# Patient Record
Sex: Female | Born: 1965 | Race: White | Hispanic: No | Marital: Married | Smoking: Never smoker
Health system: Southern US, Community
[De-identification: ages and names within clinical notes are randomized; demographics above are authoritative.]

## PROBLEM LIST (undated history)

## (undated) DIAGNOSIS — Z789 Other specified health status: Secondary | ICD-10-CM

## (undated) DIAGNOSIS — K219 Gastro-esophageal reflux disease without esophagitis: Secondary | ICD-10-CM

## (undated) DIAGNOSIS — R7303 Prediabetes: Secondary | ICD-10-CM

## (undated) HISTORY — PX: LASIK: SHX215

## (undated) HISTORY — PX: BREAST SURGERY: SHX581

## (undated) HISTORY — DX: Other specified health status: Z78.9

## (undated) HISTORY — DX: Gastro-esophageal reflux disease without esophagitis: K21.9

---

## 2004-10-25 ENCOUNTER — Ambulatory Visit: Payer: Self-pay

## 2005-10-28 ENCOUNTER — Ambulatory Visit: Payer: Self-pay

## 2007-01-11 ENCOUNTER — Ambulatory Visit: Payer: Self-pay | Admitting: Obstetrics and Gynecology

## 2011-09-23 ENCOUNTER — Ambulatory Visit: Payer: Self-pay | Admitting: Family Medicine

## 2013-03-29 ENCOUNTER — Ambulatory Visit: Payer: Self-pay | Admitting: Obstetrics and Gynecology

## 2014-08-16 ENCOUNTER — Other Ambulatory Visit: Payer: Self-pay

## 2015-02-22 ENCOUNTER — Ambulatory Visit (INDEPENDENT_AMBULATORY_CARE_PROVIDER_SITE_OTHER): Payer: 59 | Admitting: Podiatry

## 2015-02-22 ENCOUNTER — Encounter: Payer: Self-pay | Admitting: Podiatry

## 2015-02-22 ENCOUNTER — Ambulatory Visit (INDEPENDENT_AMBULATORY_CARE_PROVIDER_SITE_OTHER): Payer: 59

## 2015-02-22 VITALS — BP 118/70 | HR 90 | Resp 18

## 2015-02-22 DIAGNOSIS — R52 Pain, unspecified: Secondary | ICD-10-CM | POA: Diagnosis not present

## 2015-02-22 DIAGNOSIS — M722 Plantar fascial fibromatosis: Secondary | ICD-10-CM

## 2015-02-22 MED ORDER — DICLOFENAC SODIUM 75 MG PO TBEC: 75.0000 mg | DELAYED_RELEASE_TABLET | Freq: Two times a day (BID) | ORAL | Status: DC

## 2015-02-22 NOTE — Patient Instructions (Signed)

## 2015-02-22 NOTE — Progress Notes (Signed)
   Subjective:    Patient ID: Elizabeth Rivers, female    DOB: 1965/06/29, 49 y.o.   MRN: [id number]  HPI   49 year old female presents the office with concerns of bilateral foot pain which has been ongoing for greater than 1 year however over the last year she has noticed that has worsened somewhat. She states that she has the majority of her pain about her foot for which she points the heel. She has pain when she first gets up in the morning after prolonged periods of standing and weightbearing. She denies any history of injury or trauma. Denies any swelling or redness. No tingling or numbness. She's had no previous treatment other than taking Aleve which seems to help. The pain does not wake her up at night. Denies any claudication symptoms. No other complaints at this time.   Review of Systems  All other systems reviewed and are negative.      Objective:   Physical Exam General: AAO x3, NAD  Dermatological: Skin is warm, dry and supple bilateral. Nails x 10 are well manicured; remaining integument appears unremarkable at this time. There are no open sores, no preulcerative lesions, no rash or signs of infection present.  Vascular: Dorsalis Pedis artery and Posterior Tibial artery pedal pulses are 2/4 bilateral with immedate capillary fill time. Pedal hair growth present. No varicosities and no lower extremity edema present bilateral. There is no pain with calf compression, swelling, warmth, erythema.   Neruologic: Grossly intact via light touch bilateral. Vibratory intact via tuning fork bilateral. Protective threshold with Semmes Wienstein monofilament intact to all pedal sites bilateral. Patellar and Achilles deep tendon reflexes 2+ bilateral. No Babinski or clonus noted bilateral.   Musculoskeletal: No gross boney pedal deformities bilateral. There is tenderness palpation along the plantar medial tubercle of the calcaneus at the insertion the plantar fascial bilaterally. There is no pain  on the course of the plantar fascial bilaterally. The plantar fascia appears to be intact. There is no pain on the course last insertion of the Achilles tendon. No other areas tenderness to bilateral lower extremity. No pain, crepitus, or limitation noted with foot and ankle range of motion bilateral. Muscular strength 5/5 in all groups tested bilateral.  Gait: Unassisted, Nonantalgic.      Assessment & Plan:   49 year old female the bilateral heel pain, likely plantar fasciitis -X-rays were obtained and reviewed with the patient.  -Treatment options discussed including all alternatives, risks, and complications -Etiology of symptoms were discussed -Discussed steroid injection however she wishes to hold off at this time. -Prescribed voltaren. Discussed side effects of the medication and directed to stop if any are to occur and call the office.  -Stretching exercises daily. -Ice daily. -Discussed shoe gear modifications and orthotics. -dispensed plantar fascial brace. -Follow-up in 3 weeks or sooner if any problems arise. In the meantime, encouraged to call the office with any questions, concerns, change in symptoms.   Ovid CurdMatthew Teyanna Thielman, DPM

## 2015-03-22 ENCOUNTER — Encounter: Payer: Self-pay | Admitting: Podiatry

## 2015-03-22 ENCOUNTER — Ambulatory Visit (INDEPENDENT_AMBULATORY_CARE_PROVIDER_SITE_OTHER): Payer: 59 | Admitting: Podiatry

## 2015-03-22 VITALS — BP 130/72 | HR 91 | Resp 18

## 2015-03-22 DIAGNOSIS — M722 Plantar fascial fibromatosis: Secondary | ICD-10-CM

## 2015-03-22 NOTE — Patient Instructions (Signed)

## 2015-03-25 NOTE — Progress Notes (Signed)
Patient ID: Elizabeth Rivers, female   DOB: 10-07-1965, 49 y.o.   MRN: [id number]  Subjective: Elizabeth Rivers presents to the office today for follow-up evaluation of bilateral heel pain. They state that they are doing much better. They have been icing, stretching, try to wear supportive shoe as much as possible. She gets some occasional discomfort, but it is much improved. No pain at night. No numbness or tingling.   No other complaints at this time. No acute changes since last appointment. They deny any systemic complaints such as fevers, chills, nausea, vomiting.  Objective: General: AAO x3, NAD  Dermatological: Skin is warm, dry and supple bilateral. Nails x 10 are well manicured; remaining integument appears unremarkable at this time. There are no open sores, no preulcerative lesions, no rash or signs of infection present.  Vascular: Dorsalis Pedis artery and Posterior Tibial artery pedal pulses are 2/4 bilateral with immedate capillary fill time. Pedal hair growth present. There is no pain with calf compression, swelling, warmth, erythema.   Neruologic: Grossly intact via light touch bilateral. Vibratory intact via tuning fork bilateral. Protective threshold with Semmes Wienstein monofilament intact to all pedal sites bilateral.   Musculoskeletal: There is currently very mild discomfort to palpation along the plantar medial tubercle of the calcaneus at the insertion of the plantar fascia on the left and right foot. There is no pain along the course of the plantar fascia within the arch of the foot. Plantar fascia appears to be intact bilaterally. There is no pain with lateral compression of the calcaneus and there is no pain with vibratory sensation. There is no pain along the course or insertion of the Achilles tendon. There are no other areas of tenderness to bilateral lower extremities. No gross boney pedal deformities bilateral. No pain, crepitus, or limitation noted with foot and ankle  range of motion bilateral. Muscular strength 5/5 in all groups tested bilateral.  Gait: Unassisted, Nonantalgic.   Assessment: Presents for follow-up evaluation for heel pain, likely plantar fasciitis   Plan: -Treatment options discussed including all alternatives, risks, and complications -Discussed steroid injection,  However this time since her pain is greatly improved we will hold off on that at this time. -Ice and stretching exercises on a daily basis. -Continue supportive shoe gear and discussed orthotics.  -Voltaren prn -She is doing well at this point and not having any pain currently.  Follow-up as needed or if any problems arise. In the meantime, encouraged to call the office with any questions, concerns, change in symptoms.   Ovid CurdMatthew Wagoner, DPM

## 2015-10-03 ENCOUNTER — Other Ambulatory Visit: Payer: Self-pay | Admitting: Podiatry

## 2016-03-05 ENCOUNTER — Other Ambulatory Visit: Payer: Self-pay | Admitting: Obstetrics and Gynecology

## 2016-03-05 DIAGNOSIS — Z1231 Encounter for screening mammogram for malignant neoplasm of breast: Secondary | ICD-10-CM

## 2016-04-18 ENCOUNTER — Telehealth: Payer: Self-pay | Admitting: Gastroenterology

## 2016-04-18 ENCOUNTER — Ambulatory Visit
Admission: RE | Admit: 2016-04-18 | Discharge: 2016-04-18 | Disposition: A | Discharge status: Home / Self Care | Payer: 59 | Source: Ambulatory Visit | Attending: Obstetrics and Gynecology | Admitting: Obstetrics and Gynecology

## 2016-04-18 DIAGNOSIS — Z1231 Encounter for screening mammogram for malignant neoplasm of breast: Secondary | ICD-10-CM | POA: Diagnosis present

## 2016-04-18 NOTE — Telephone Encounter (Signed)
colonoscopy

## 2016-04-22 LAB — HM PAP SMEAR: HM PAP: NORMAL

## 2016-05-08 NOTE — Telephone Encounter (Signed)
VM was left for pt on 05/02/16. 

## 2016-06-20 ENCOUNTER — Telehealth: Payer: Self-pay

## 2016-06-20 NOTE — Telephone Encounter (Signed)
LVM for pt callback to schedule screening colonoscopy per Dr. Thomasene MohairStephen Jackson

## 2016-07-15 ENCOUNTER — Other Ambulatory Visit: Payer: Self-pay | Admitting: Obstetrics and Gynecology

## 2016-07-15 DIAGNOSIS — Z1211 Encounter for screening for malignant neoplasm of colon: Secondary | ICD-10-CM

## 2016-07-24 ENCOUNTER — Other Ambulatory Visit: Payer: Self-pay | Admitting: Obstetrics and Gynecology

## 2016-07-24 DIAGNOSIS — Z1211 Encounter for screening for malignant neoplasm of colon: Secondary | ICD-10-CM

## 2016-07-27 ENCOUNTER — Other Ambulatory Visit: Payer: Self-pay | Admitting: Obstetrics and Gynecology

## 2016-07-27 DIAGNOSIS — Z1211 Encounter for screening for malignant neoplasm of colon: Secondary | ICD-10-CM

## 2017-04-22 ENCOUNTER — Ambulatory Visit (INDEPENDENT_AMBULATORY_CARE_PROVIDER_SITE_OTHER): Payer: 59 | Admitting: Obstetrics and Gynecology

## 2017-04-22 ENCOUNTER — Encounter: Payer: Self-pay | Admitting: Obstetrics and Gynecology

## 2017-04-22 VITALS — BP 118/74 | Ht 64.0 in | Wt 234.0 lb

## 2017-04-22 DIAGNOSIS — Z13220 Encounter for screening for lipoid disorders: Secondary | ICD-10-CM

## 2017-04-22 DIAGNOSIS — Z0000 Encounter for general adult medical examination without abnormal findings: Secondary | ICD-10-CM | POA: Diagnosis not present

## 2017-04-22 DIAGNOSIS — Z1329 Encounter for screening for other suspected endocrine disorder: Secondary | ICD-10-CM

## 2017-04-22 DIAGNOSIS — Z131 Encounter for screening for diabetes mellitus: Secondary | ICD-10-CM | POA: Diagnosis not present

## 2017-04-22 DIAGNOSIS — Z1321 Encounter for screening for nutritional disorder: Secondary | ICD-10-CM | POA: Diagnosis not present

## 2017-04-22 DIAGNOSIS — Z1331 Encounter for screening for depression: Secondary | ICD-10-CM | POA: Diagnosis not present

## 2017-04-22 DIAGNOSIS — Z01419 Encounter for gynecological examination (general) (routine) without abnormal findings: Secondary | ICD-10-CM

## 2017-04-22 DIAGNOSIS — Z1339 Encounter for screening examination for other mental health and behavioral disorders: Secondary | ICD-10-CM

## 2017-04-22 NOTE — Progress Notes (Signed)
Routine Annual Gynecology Examination   PCP: Conard Novak, MD  Chief Complaint  Patient presents with  . Annual Exam   History of Present Illness: Patient is a 52 y.o. G2P2002 presents for annual exam. The patient has no complaints today.   Menopausal bleeding: denies  Menopausal symptoms: denies  Breast symptoms: denies  Last pap smear: 1 years ago.  Result Normal  Last mammogram: 1 years ago.  Result Normal   Colonoscopy: ordered last year, but patient did not make referral appointment.  Discussed Cologard versus just getting colonoscopy.  Past Medical History: denies  Past Surgical History:  Procedure Laterality Date  . BREAST SURGERY    . LASIK      Medications   Medication Sig Start Date End Date Taking? Authorizing Provider  Ascorbic Acid (VITAMIN C) 100 MG tablet Take 100 mg by mouth daily.   Yes [provider]  AFLURIA PRESERVATIVE FREE 0.5 ML SUSY inject 0.5 milliliter intramuscularly 01/29/15   [provider]   Allergies: No Known Allergies  Obstetric History: [id number]  Social History   Socioeconomic History  . Marital status: Married    Spouse name: Not on file  . Number of children: Not on file  . Years of education: Not on file  . Highest education level: Not on file  Social Needs  . Financial resource strain: Not on file  . Food insecurity - worry: Not on file  . Food insecurity - inability: Not on file  . Transportation needs - medical: Not on file  . Transportation needs - non-medical: Not on file  Occupational History  . Not on file  Tobacco Use  . Smoking status: Never Smoker  . Smokeless tobacco: Never Used  Substance and Sexual Activity  . Alcohol use: No    Alcohol/week: 0.0 oz  . Drug use: No  . Sexual activity: Not Currently    Birth control/protection: Surgical  Other Topics Concern  . Not on file  Social History Narrative  . Not on file    Family History  Problem Relation Age of Onset  .  Diabetes Mellitus II Mother   . Heart disease Mother   . Diabetes Mellitus II Father   . Heart disease Father   . Hypertension Maternal Grandmother   . Breast cancer Neg Hx     Review of Systems  Constitutional: Negative.   HENT: Negative.   Eyes: Negative.   Respiratory: Negative.   Cardiovascular: Negative.   Gastrointestinal: Negative.   Genitourinary: Negative.   Musculoskeletal: Negative.   Skin: Negative.   Neurological: Negative.   Psychiatric/Behavioral: Negative.      Physical Exam Vitals: BP 118/74   Ht 5\' 4"  (1.626 m)   Wt 234 lb (106.1 kg)   BMI 40.17 kg/m   Physical Exam  Constitutional: She is oriented to person, place, and time. She appears well-developed and well-nourished. No distress.  Genitourinary: Uterus normal. Pelvic exam was performed with patient supine. There is no rash, tenderness, lesion or injury on the right labia. There is no rash, tenderness, lesion or injury on the left labia. No erythema, tenderness or bleeding in the vagina. No signs of injury around the vagina. No vaginal discharge found. Right adnexum does not display mass, does not display tenderness and does not display fullness. Left adnexum does not display mass, does not display tenderness and does not display fullness. Cervix does not exhibit motion tenderness, lesion, discharge or polyp.   Uterus is mobile and anteverted. Uterus  is not enlarged, tender or exhibiting a mass.  HENT:  Head: Normocephalic and atraumatic.  Eyes: EOM are normal. No scleral icterus.  Neck: Normal range of motion. Neck supple. No thyromegaly present.  Cardiovascular: Normal rate and regular rhythm. Exam reveals no gallop and no friction rub.  No murmur heard. Pulmonary/Chest: Effort normal and breath sounds normal. No respiratory distress. She has no wheezes. She has no rales. Right breast exhibits no inverted nipple, no mass, no nipple discharge, no skin change and no tenderness. Left breast exhibits no  inverted nipple, no mass, no nipple discharge, no skin change and no tenderness.  Abdominal: Soft. Bowel sounds are normal. She exhibits no distension and no mass. There is no tenderness. There is no rebound and no guarding.  Musculoskeletal: Normal range of motion. She exhibits no edema or tenderness.  Lymphadenopathy:    She has no cervical adenopathy.       Right: No inguinal adenopathy present.       Left: No inguinal adenopathy present.  Neurological: She is alert and oriented to person, place, and time. No cranial nerve deficit.  Skin: Skin is warm and dry. No rash noted. No erythema.  Psychiatric: She has a normal mood and affect. Her behavior is normal. Judgment normal.    Female chaperone present for pelvic and breast  portions of the physical exam  Results: AUDIT Questionnaire (screen for alcoholism): 0 PHQ-9: 6   Assessment and Plan:  52 y.o. 212P2002 female here for routine annual gynecologic examination  Plan: Problem List Items Addressed This Visit    None    Visit Diagnoses    Women's annual routine gynecological examination    -  Primary   Relevant Orders   Hgb A1c w/o eAG   TSH   Lipid Panel With LDL/HDL Ratio   CBC   Comprehensive metabolic panel   VITAMIN D 25 Hydroxy (Vit-D Deficiency, Fractures)   Screening for depression       Screening for alcoholism       Screening cholesterol level       Relevant Orders   Lipid Panel With LDL/HDL Ratio   Screening for diabetes mellitus       Relevant Orders   Hgb A1c w/o eAG   Screening for thyroid disorder       Relevant Orders   TSH   Encounter for vitamin deficiency screening       Relevant Orders   VITAMIN D 25 Hydroxy (Vit-D Deficiency, Fractures)   Laboratory examination ordered as part of a routine general medical examination       Relevant Orders   CBC   Comprehensive metabolic panel   VITAMIN D 25 Hydroxy (Vit-D Deficiency, Fractures)      Screening: -- Blood pressure screen normal --  Colonoscopy - due.  Patient will check with new insurance to see which providers are covered. Will consider Cologuard -- Mammogram - due. Patient to call [facility] to schedule -- Weight screening: obese: discussed management options, including lifestyle, dietary, and exercise. -- Depression screening negative (PHQ-9) -- Nutrition: normal -- cholesterol screening: will obtain -- osteoporosis screening: not due -- tobacco screening: not using -- alcohol screening: AUDIT questionnaire indicates low-risk usage. -- family history of breast cancer screening: done. not at high risk. -- no evidence of domestic violence or intimate partner violence. -- STD screening: gonorrhea/chlamydia NAAT not collected per patient request. -- pap smear not collected per ASCCP guidelines -- HPV vaccination series: not eligilbe  Jeannett SeniorStephen  Jean Rosenthal, MD 04/22/2017 5:35 PM

## 2017-04-23 LAB — COMPREHENSIVE METABOLIC PANEL
A/G RATIO: 1.5 (ref 1.2–2.2)
ALBUMIN: 4.3 g/dL (ref 3.5–5.5)
ALT: 12 IU/L (ref 0–32)
AST: 12 IU/L (ref 0–40)
Alkaline Phosphatase: 120 IU/L — ABNORMAL HIGH (ref 39–117)
BUN / CREAT RATIO: 9 (ref 9–23)
BUN: 10 mg/dL (ref 6–24)
Bilirubin Total: 0.3 mg/dL (ref 0.0–1.2)
CALCIUM: 9 mg/dL (ref 8.7–10.2)
CO2: 23 mmol/L (ref 20–29)
Chloride: 103 mmol/L (ref 96–106)
Creatinine, Ser: 1.06 mg/dL — ABNORMAL HIGH (ref 0.57–1.00)
GFR, EST AFRICAN AMERICAN: 70 mL/min/{1.73_m2} (ref >59)
GFR, EST NON AFRICAN AMERICAN: 61 mL/min/{1.73_m2} (ref >59)
Globulin, Total: 2.8 g/dL (ref 1.5–4.5)
Glucose: 75 mg/dL (ref 65–99)
POTASSIUM: 3.7 mmol/L (ref 3.5–5.2)
Sodium: 144 mmol/L (ref 134–144)
Total Protein: 7.1 g/dL (ref 6.0–8.5)

## 2017-04-23 LAB — LIPID PANEL WITH LDL/HDL RATIO
CHOLESTEROL TOTAL: 217 mg/dL — AB (ref 100–199)
HDL: 45 mg/dL (ref >39)
LDL Calculated: 149 mg/dL — ABNORMAL HIGH (ref 0–99)
LDl/HDL Ratio: 3.3 ratio — ABNORMAL HIGH (ref 0.0–3.2)
Triglycerides: 114 mg/dL (ref 0–149)
VLDL Cholesterol Cal: 23 mg/dL (ref 5–40)

## 2017-04-23 LAB — CBC
HEMATOCRIT: 37.3 % (ref 34.0–46.6)
HEMOGLOBIN: 11.8 g/dL (ref 11.1–15.9)
MCH: 26 pg — AB (ref 26.6–33.0)
MCHC: 31.6 g/dL (ref 31.5–35.7)
MCV: 82 fL (ref 79–97)
Platelets: 312 10*3/uL (ref 150–379)
RBC: 4.53 x10E6/uL (ref 3.77–5.28)
RDW: 15.9 % — ABNORMAL HIGH (ref 12.3–15.4)
WBC: 7.7 10*3/uL (ref 3.4–10.8)

## 2017-04-23 LAB — HGB A1C W/O EAG: HEMOGLOBIN A1C: 5.9 % — AB (ref 4.8–5.6)

## 2017-04-23 LAB — VITAMIN D 25 HYDROXY (VIT D DEFICIENCY, FRACTURES): Vit D, 25-Hydroxy: 14.9 ng/mL — ABNORMAL LOW (ref 30.0–100.0)

## 2017-04-23 LAB — TSH: TSH: 2 u[IU]/mL (ref 0.450–4.500)

## 2017-05-11 ENCOUNTER — Telehealth: Payer: Self-pay | Admitting: Obstetrics and Gynecology

## 2017-05-11 NOTE — Telephone Encounter (Signed)
Spoke with patient regarding results. Recommended dietary/nutrition counseling for hemoglobin A1c and increase vitamin D/Calcium intake.  Offered 50,000 units vitamin D. But, she would like to try supplementation with OTC dosing for now. Also, discussed lipid panel results. Will watch them closely, but would prefer to have them come down.  Again offered nutrition/dietary counseling.

## 2017-05-19 ENCOUNTER — Encounter: Payer: Self-pay | Admitting: Obstetrics and Gynecology

## 2017-06-09 ENCOUNTER — Encounter: Payer: Self-pay | Admitting: Obstetrics and Gynecology

## 2017-12-15 ENCOUNTER — Telehealth: Payer: Self-pay

## 2017-12-15 NOTE — Telephone Encounter (Signed)
Pt called triage line stating she has noticed red blood streaks on toilet tissue for the past 2 days. She has not had a period in 3 years. She is wanting to speak to [facility]. She is leaving to go on vacation this weekend. CB# [phone]  I tried to get her to schedule an appointment for when she gets back from vacation. She states she would rather talk to him first. I assured her he would need to see her but that I would pass on this message.

## 2017-12-15 NOTE — Telephone Encounter (Signed)
Please get pt scheduled, tried to call pt with no answer. She will need to come in for appt with SDJ  to see what is going on.

## 2018-06-11 ENCOUNTER — Other Ambulatory Visit: Payer: Self-pay

## 2018-06-11 ENCOUNTER — Ambulatory Visit (INDEPENDENT_AMBULATORY_CARE_PROVIDER_SITE_OTHER): Payer: 59 | Admitting: Obstetrics and Gynecology

## 2018-06-11 ENCOUNTER — Encounter: Payer: Self-pay | Admitting: Obstetrics and Gynecology

## 2018-06-11 VITALS — BP 106/64 | HR 97 | Ht 64.0 in | Wt 237.0 lb

## 2018-06-11 DIAGNOSIS — Z0000 Encounter for general adult medical examination without abnormal findings: Secondary | ICD-10-CM

## 2018-06-11 DIAGNOSIS — Z01419 Encounter for gynecological examination (general) (routine) without abnormal findings: Secondary | ICD-10-CM

## 2018-06-11 DIAGNOSIS — E559 Vitamin D deficiency, unspecified: Secondary | ICD-10-CM

## 2018-06-11 DIAGNOSIS — Z1329 Encounter for screening for other suspected endocrine disorder: Secondary | ICD-10-CM

## 2018-06-11 DIAGNOSIS — Z1331 Encounter for screening for depression: Secondary | ICD-10-CM

## 2018-06-11 DIAGNOSIS — Z1339 Encounter for screening examination for other mental health and behavioral disorders: Secondary | ICD-10-CM

## 2018-06-11 DIAGNOSIS — Z131 Encounter for screening for diabetes mellitus: Secondary | ICD-10-CM

## 2018-06-11 DIAGNOSIS — Z13220 Encounter for screening for lipoid disorders: Secondary | ICD-10-CM

## 2018-06-11 DIAGNOSIS — Z1211 Encounter for screening for malignant neoplasm of colon: Secondary | ICD-10-CM

## 2018-06-11 NOTE — Progress Notes (Signed)
Routine Annual Gynecology Examination   PCP: Conard Novak, MD  Chief Complaint  Patient presents with  . Gynecologic Exam    No Complaints   History of Present Illness: Patient is a 53 y.o. [id number] presents for annual exam. The patient has no complaints today.   Menopausal bleeding: denies  Menopausal symptoms: denies  Breast symptoms: denies  Last pap smear: 2 years ago.  Result Normal  Last mammogram: 1 years ago.  Result Normal. She has not scheduled another one.   Last colonoscopy: never had.  Did not do Cologuard last year.  Past Medical History:  Diagnosis Date  . No known health problems     Past Surgical History:  Procedure Laterality Date  . BREAST SURGERY    . LASIK     Prior to Admission medications   Medication Sig Start Date End Date Taking? Authorizing Provider  AFLURIA PRESERVATIVE FREE 0.5 ML SUSY inject 0.5 milliliter intramuscularly 01/29/15   [provider]  Ascorbic Acid (VITAMIN C) 100 MG tablet Take 100 mg by mouth daily.    [provider]  diclofenac (VOLTAREN) 75 MG EC tablet Take 1 tablet (75 mg total) by mouth 2 (two) times daily. Patient not taking: Reported on 04/22/2017 02/22/15   Vivi Barrack, DPM  medroxyPROGESTERone (PROVERA) 10 MG tablet TAKE 1 TABLET BY MOUTH ONCE DAILY FOR 10 DAYS 01/26/15   [provider]   Allergies: No Known Allergies  Obstetric History: [id number]  Social History   Socioeconomic History  . Marital status: Married    Spouse name: Not on file  . Number of children: Not on file  . Years of education: Not on file  . Highest education level: Not on file  Occupational History  . Occupation: Biochemist, clinical  Social Needs  . Financial resource strain: Not on file  . Food insecurity:    Worry: Not on file    Inability: Not on file  . Transportation needs:    Medical: Not on file    Non-medical: Not on file  Tobacco Use  . Smoking status: Never Smoker  .  Smokeless tobacco: Never Used  Substance and Sexual Activity  . Alcohol use: No    Alcohol/week: 0.0 standard drinks  . Drug use: No  . Sexual activity: Not Currently    Birth control/protection: Surgical  Lifestyle  . Physical activity:    Days per week: 0 days    Minutes per session: Not on file  . Stress: Not on file  Relationships  . Social connections:    Talks on phone: Not on file    Gets together: Not on file    Attends religious service: Not on file    Active member of club or organization: Not on file    Attends meetings of clubs or organizations: Not on file    Relationship status: Not on file  . Intimate partner violence:    Fear of current or ex partner: Not on file    Emotionally abused: Not on file    Physically abused: Not on file    Forced sexual activity: Not on file  Other Topics Concern  . Not on file  Social History Narrative  . Not on file    Family History  Problem Relation Age of Onset  . Diabetes Mellitus II Mother   . Heart disease Mother   . Diabetes Mellitus II Father   . Heart disease Father   . Hypertension Maternal Grandmother   .  Throat cancer Paternal Uncle   . Breast cancer Neg Hx     Review of Systems  Constitutional: Negative.   HENT: Negative.   Eyes: Negative.   Respiratory: Negative.   Cardiovascular: Negative.   Gastrointestinal: Negative.   Genitourinary: Negative.   Musculoskeletal: Negative.   Skin: Negative.   Neurological: Negative.   Psychiatric/Behavioral: Negative.      Physical Exam Vitals: BP 106/64 (BP Location: Left Arm, Patient Position: Sitting, Cuff Size: Normal)   Pulse 97   Ht 5\' 4"  (1.626 m)   Wt 237 lb (107.5 kg)   BMI 40.68 kg/m   Physical Exam Constitutional:      General: She is not in acute distress.    Appearance: Normal appearance. She is well-developed.  Genitourinary:     Pelvic exam was performed with patient supine.     Vulva, urethra, bladder and uterus normal.     No inguinal  adenopathy present in the right or left side.    No signs of injury in the vagina.     No vaginal discharge, erythema, tenderness or bleeding.     No cervical motion tenderness, discharge, lesion or polyp.     Uterus is mobile.     Uterus is not enlarged or tender.     No uterine mass detected.    Uterus is anteverted.     No right or left adnexal mass present.     Right adnexa not tender or full.     Left adnexa not tender or full.  HENT:     Head: Normocephalic and atraumatic.  Eyes:     General: No scleral icterus.    Conjunctiva/sclera: Conjunctivae normal.  Neck:     Musculoskeletal: Normal range of motion and neck supple.     Thyroid: No thyromegaly.  Cardiovascular:     Rate and Rhythm: Normal rate and regular rhythm.     Heart sounds: No murmur. No friction rub. No gallop.   Pulmonary:     Effort: Pulmonary effort is normal. No respiratory distress.     Breath sounds: Normal breath sounds. No wheezing or rales.  Chest:     Breasts:        Right: No inverted nipple, mass, nipple discharge, skin change or tenderness.        Left: No inverted nipple, mass, nipple discharge, skin change or tenderness.  Abdominal:     General: Bowel sounds are normal. There is no distension.     Palpations: Abdomen is soft. There is no mass.     Tenderness: There is no abdominal tenderness. There is no guarding or rebound.  Musculoskeletal: Normal range of motion.        General: No swelling or tenderness.  Lymphadenopathy:     Cervical: No cervical adenopathy.     Lower Body: No right inguinal adenopathy. No left inguinal adenopathy.  Neurological:     General: No focal deficit present.     Mental Status: She is alert and oriented to person, place, and time.     Cranial Nerves: No cranial nerve deficit.  Skin:    General: Skin is warm and dry.     Findings: No erythema or rash.     Comments: Under left axilla there are two large apparent skin tags  Psychiatric:        Mood and  Affect: Mood normal.        Behavior: Behavior normal.        Judgment: Judgment normal.  Female chaperone present for pelvic and breast  portions of the physical exam  Results: AUDIT Questionnaire (screen for alcoholism): 0 PHQ-9: 7   Assessment and Plan:  53 y.o. G2P2002 female here for routine annual gynecologic examination  Plan: Problem List Items Addressed This Visit    None    Visit Diagnoses    Women's annual routine gynecological examination    -  Primary   Relevant Orders   Cologuard   Hgb A1c w/o eAG   TSH + free T4   VITAMIN D 25 Hydroxy (Vit-D Deficiency, Fractures)   CBC   Comprehensive metabolic panel   Lipid Panel With LDL/HDL Ratio   Screening for depression       Screening for alcoholism       Screen for colon cancer       Relevant Orders   Cologuard   Screening for thyroid disorder       Relevant Orders   TSH + free T4   Screening cholesterol level       Relevant Orders   Lipid Panel With LDL/HDL Ratio   Screening for diabetes mellitus       Relevant Orders   Hgb A1c w/o eAG   Laboratory examination ordered as part of a routine general medical examination       Relevant Orders   Hgb A1c w/o eAG   TSH + free T4   VITAMIN D 25 Hydroxy (Vit-D Deficiency, Fractures)   CBC   Comprehensive metabolic panel   Lipid Panel With LDL/HDL Ratio   Hypovitaminosis D       Relevant Orders   VITAMIN D 25 Hydroxy (Vit-D Deficiency, Fractures)     Screening: -- Blood pressure screen normal -- Colonoscopy - will order Cologuard -- Mammogram - due. Patient to schedule -- Weight screening: obese: discussed management options, including lifestyle, dietary, and exercise. -- Depression screening negative (PHQ-9) -- Nutrition: normal -- cholesterol screening: will obtain -- osteoporosis screening: not due -- tobacco screening: not using -- alcohol screening: AUDIT questionnaire indicates low-risk usage. -- family history of breast cancer screening: done.  not at high risk. -- no evidence of domestic violence or intimate partner violence. -- STD screening: gonorrhea/chlamydia NAAT not collected per patient request. -- pap smear not collected per ASCCP guidelines -- flu vaccine received -- HPV vaccination series: not eligilbe  Thomasene Mohair, MD 06/11/2018 4:02 PM

## 2018-06-12 LAB — CBC
Hematocrit: 38.1 % (ref 34.0–46.6)
Hemoglobin: 12.1 g/dL (ref 11.1–15.9)
MCH: 26.5 pg — AB (ref 26.6–33.0)
MCHC: 31.8 g/dL (ref 31.5–35.7)
MCV: 84 fL (ref 79–97)
PLATELETS: 300 10*3/uL (ref 150–450)
RBC: 4.56 x10E6/uL (ref 3.77–5.28)
RDW: 14.4 % (ref 11.7–15.4)
WBC: 7.7 10*3/uL (ref 3.4–10.8)

## 2018-06-12 LAB — COMPREHENSIVE METABOLIC PANEL
ALK PHOS: 110 IU/L (ref 39–117)
ALT: 16 IU/L (ref 0–32)
AST: 14 IU/L (ref 0–40)
Albumin/Globulin Ratio: 1.8 (ref 1.2–2.2)
Albumin: 4.2 g/dL (ref 3.8–4.9)
BILIRUBIN TOTAL: 0.2 mg/dL (ref 0.0–1.2)
BUN/Creatinine Ratio: 14 (ref 9–23)
BUN: 12 mg/dL (ref 6–24)
CHLORIDE: 103 mmol/L (ref 96–106)
CO2: 23 mmol/L (ref 20–29)
Calcium: 9.3 mg/dL (ref 8.7–10.2)
Creatinine, Ser: 0.87 mg/dL (ref 0.57–1.00)
GFR calc Af Amer: 88 mL/min/{1.73_m2} (ref >59)
GFR calc non Af Amer: 76 mL/min/{1.73_m2} (ref >59)
Globulin, Total: 2.3 g/dL (ref 1.5–4.5)
Glucose: 82 mg/dL (ref 65–99)
Potassium: 4.1 mmol/L (ref 3.5–5.2)
Sodium: 140 mmol/L (ref 134–144)
Total Protein: 6.5 g/dL (ref 6.0–8.5)

## 2018-06-12 LAB — TSH+FREE T4
FREE T4: 1.02 ng/dL (ref 0.82–1.77)
TSH: 2.44 u[IU]/mL (ref 0.450–4.500)

## 2018-06-12 LAB — LIPID PANEL WITH LDL/HDL RATIO
Cholesterol, Total: 205 mg/dL — ABNORMAL HIGH (ref 100–199)
HDL: 47 mg/dL (ref >39)
LDL Calculated: 125 mg/dL — ABNORMAL HIGH (ref 0–99)
LDl/HDL Ratio: 2.7 ratio (ref 0.0–3.2)
TRIGLYCERIDES: 163 mg/dL — AB (ref 0–149)
VLDL Cholesterol Cal: 33 mg/dL (ref 5–40)

## 2018-06-12 LAB — HGB A1C W/O EAG: Hgb A1c MFr Bld: 6 % — ABNORMAL HIGH (ref 4.8–5.6)

## 2018-06-12 LAB — VITAMIN D 25 HYDROXY (VIT D DEFICIENCY, FRACTURES): VIT D 25 HYDROXY: 11.1 ng/mL — AB (ref 30.0–100.0)

## 2019-07-05 ENCOUNTER — Other Ambulatory Visit: Payer: Self-pay

## 2019-07-05 ENCOUNTER — Encounter: Payer: Self-pay | Admitting: Obstetrics and Gynecology

## 2019-07-05 ENCOUNTER — Other Ambulatory Visit (HOSPITAL_COMMUNITY)
Admission: RE | Admit: 2019-07-05 | Discharge: 2019-07-05 | Disposition: A | Discharge status: Home / Self Care | Payer: 59 | Source: Ambulatory Visit | Attending: Obstetrics and Gynecology | Admitting: Obstetrics and Gynecology

## 2019-07-05 ENCOUNTER — Ambulatory Visit (INDEPENDENT_AMBULATORY_CARE_PROVIDER_SITE_OTHER): Payer: 59 | Admitting: Obstetrics and Gynecology

## 2019-07-05 VITALS — BP 122/74 | Ht 64.0 in | Wt 234.0 lb

## 2019-07-05 DIAGNOSIS — Z01411 Encounter for gynecological examination (general) (routine) with abnormal findings: Secondary | ICD-10-CM | POA: Diagnosis not present

## 2019-07-05 DIAGNOSIS — Z1211 Encounter for screening for malignant neoplasm of colon: Secondary | ICD-10-CM

## 2019-07-05 DIAGNOSIS — Z01419 Encounter for gynecological examination (general) (routine) without abnormal findings: Secondary | ICD-10-CM | POA: Diagnosis present

## 2019-07-05 DIAGNOSIS — Z124 Encounter for screening for malignant neoplasm of cervix: Secondary | ICD-10-CM

## 2019-07-05 DIAGNOSIS — Z1231 Encounter for screening mammogram for malignant neoplasm of breast: Secondary | ICD-10-CM

## 2019-07-05 DIAGNOSIS — Z1339 Encounter for screening examination for other mental health and behavioral disorders: Secondary | ICD-10-CM

## 2019-07-05 DIAGNOSIS — Z1331 Encounter for screening for depression: Secondary | ICD-10-CM

## 2019-07-05 DIAGNOSIS — N950 Postmenopausal bleeding: Secondary | ICD-10-CM

## 2019-07-05 NOTE — Progress Notes (Signed)
Routine Annual Gynecology Examination   PCP: Will Bonnet, MD  Chief Complaint  Patient presents with  . Annual Exam   History of Present Illness: Patient is a 54 y.o. [id number] presents for annual exam. The patient has no complaints today.   Menopausal bleeding: notes very light vaginal bleeding.  She noted light pink just when she wiped.  Denies weight loss, early satiety, constipation, bloating.  Menopausal symptoms: reports mild hot flushes. Less than before.   Breast symptoms: denies  Last pap smear: 3 years ago.  Result Normal  Last mammogram: 2 years ago.  Result Normal   Last colonoscopy: hasn't had colonoscopy or Cologuard.  She still has Cologuard at home.   Past Medical History:  Diagnosis Date  . No known health problems     Past Surgical History:  Procedure Laterality Date  . BREAST SURGERY    . LASIK      Prior to Admission medications: denies   Allergies:  No Known Allergies  Obstetric History: [id number]  Social History   Socioeconomic History  . Marital status: Married    Spouse name: Not on file  . Number of children: Not on file  . Years of education: Not on file  . Highest education level: Not on file  Occupational History  . Occupation: IT sales professional  Tobacco Use  . Smoking status: Never Smoker  . Smokeless tobacco: Never Used  Substance and Sexual Activity  . Alcohol use: No    Alcohol/week: 0.0 standard drinks  . Drug use: No  . Sexual activity: Not Currently    Birth control/protection: Surgical  Other Topics Concern  . Not on file  Social History Narrative  . Not on file   Social Determinants of Health   Financial Resource Strain:   . Difficulty of Paying Living Expenses:   Food Insecurity:   . Worried About Charity fundraiser in the Last Year:   . Arboriculturist in the Last Year:   Transportation Needs:   . Film/video editor (Medical):   Marland Kitchen Lack of Transportation (Non-Medical):   Physical  Activity:   . Days of Exercise per Week:   . Minutes of Exercise per Session:   Stress:   . Feeling of Stress :   Social Connections:   . Frequency of Communication with Friends and Family:   . Frequency of Social Gatherings with Friends and Family:   . Attends Religious Services:   . Active Member of Clubs or Organizations:   . Attends Archivist Meetings:   Marland Kitchen Marital Status:   Intimate Partner Violence:   . Fear of Current or Ex-Partner:   . Emotionally Abused:   Marland Kitchen Physically Abused:   . Sexually Abused:     Family History  Problem Relation Age of Onset  . Diabetes Mellitus II Mother   . Heart disease Mother   . Diabetes Mellitus II Father   . Heart disease Father   . Hypertension Maternal Grandmother   . Throat cancer Paternal Uncle   . Breast cancer Neg Hx     Review of Systems  Constitutional: Negative.   HENT: Negative.   Eyes: Negative.   Respiratory: Negative.   Cardiovascular: Negative.   Gastrointestinal: Positive for diarrhea. Negative for abdominal pain, blood in stool, constipation, heartburn, melena, nausea and vomiting.  Genitourinary: Negative.        Very light vaginal bleeding  Musculoskeletal: Positive for joint pain. Negative for back pain, falls, myalgias  and neck pain.  Skin: Negative.   Neurological: Negative.   Endo/Heme/Allergies: Positive for environmental allergies. Negative for polydipsia. Does not bruise/bleed easily.  Psychiatric/Behavioral: Negative.      Physical Exam Vitals: BP 122/74   Ht 5\' 4"  (1.626 m)   Wt 234 lb (106.1 kg)   BMI 40.17 kg/m   Physical Exam Constitutional:      General: She is not in acute distress.    Appearance: Normal appearance. She is well-developed.  Genitourinary:     Pelvic exam was performed with patient supine.     Vulva, urethra, bladder and uterus normal.     No inguinal adenopathy present in the right or left side.    No signs of injury in the vagina.     No vaginal discharge,  erythema, tenderness or bleeding.     No cervical motion tenderness, discharge, lesion or polyp.     Uterus is mobile.     Uterus is not enlarged or tender.     No uterine mass detected.    Uterus is anteverted.     No right or left adnexal mass present.     Right adnexa not tender or full.     Left adnexa not tender or full.  HENT:     Head: Normocephalic and atraumatic.  Eyes:     General: No scleral icterus.    Conjunctiva/sclera: Conjunctivae normal.  Neck:     Thyroid: No thyromegaly.  Cardiovascular:     Rate and Rhythm: Normal rate and regular rhythm.     Heart sounds: No murmur. No friction rub. No gallop.   Pulmonary:     Effort: Pulmonary effort is normal. No respiratory distress.     Breath sounds: Normal breath sounds. No wheezing or rales.  Chest:     Breasts:        Right: No inverted nipple, mass, nipple discharge, skin change or tenderness.        Left: No inverted nipple, mass, nipple discharge, skin change or tenderness.  Abdominal:     General: Bowel sounds are normal. There is no distension.     Palpations: Abdomen is soft. There is no mass.     Tenderness: There is no abdominal tenderness. There is no guarding or rebound.  Musculoskeletal:        General: No swelling or tenderness. Normal range of motion.     Cervical back: Normal range of motion and neck supple.  Lymphadenopathy:     Cervical: No cervical adenopathy.     Lower Body: No right inguinal adenopathy. No left inguinal adenopathy.  Neurological:     General: No focal deficit present.     Mental Status: She is alert and oriented to person, place, and time.     Cranial Nerves: No cranial nerve deficit.  Skin:    General: Skin is warm and dry.     Findings: No erythema or rash.  Psychiatric:        Mood and Affect: Mood normal.        Behavior: Behavior normal.        Judgment: Judgment normal.      Female chaperone present for pelvic and breast  portions of the physical exam  Results:  AUDIT Questionnaire (screen for alcoholism): 0 PHQ-9: 4   Assessment and Plan:  54 y.o. G52P2002 female here for routine annual gynecologic examination  Plan: Problem List Items Addressed This Visit    None    Visit Diagnoses  Women's annual routine gynecological examination    -  Primary   Relevant Orders   MM DIGITAL SCREENING BILATERAL   Cologuard   US PELVIS TRANSVAGINAL NON-OB (TV ONLY)   Cytology - PAP   Screening for depression       Screening for alcoholism       Pap smear for cervical cancer screening       Relevant Orders   Cytology - PAP   Encounter for screening mammogram for malignant neoplasm of breast       Relevant Orders   MM DIGITAL SCREENING BILATERAL   Screen for colon cancer       Relevant Orders   Cologuard   Postmenopausal bleeding       Relevant Orders   US PELVIS TRANSVAGINAL NON-OB (TV ONLY)      Screening: -- Blood pressure screen normal -- Colonoscopy - will get Cologuard -- Mammogram - due. Patient to call [facility] to arrange. She understands that it is her responsibility to arrange this. -- Weight screening: obese: discussed management options, including lifestyle, dietary, and exercise. -- Depression screening negative (PHQ-9) -- Nutrition: normal -- cholesterol screening: per PCP -- osteoporosis screening: not due -- tobacco screening: not using -- alcohol screening: AUDIT questionnaire indicates low-risk usage. -- family history of breast cancer screening: done. not at high risk. -- no evidence of domestic violence or intimate partner violence. -- STD screening: gonorrhea/chlamydia NAAT not collected per patient request. -- pap smear collected per ASCCP guidelines  Postmenopausal bleeding: pelvic u/s. Follow up accordingly. Endometrial biopsy, if indicated. Pap smear today.   Thomasene Mohair, MD 07/05/2019 2:34 PM

## 2019-07-07 LAB — CYTOLOGY - PAP
Comment: NEGATIVE
Diagnosis: NEGATIVE
High risk HPV: NEGATIVE

## 2019-07-26 ENCOUNTER — Encounter: Payer: Self-pay | Admitting: Obstetrics and Gynecology

## 2019-07-26 ENCOUNTER — Other Ambulatory Visit: Payer: Self-pay | Admitting: Obstetrics and Gynecology

## 2019-07-26 ENCOUNTER — Encounter: Payer: Self-pay | Admitting: General Practice

## 2019-07-26 ENCOUNTER — Other Ambulatory Visit: Payer: Self-pay

## 2019-07-26 ENCOUNTER — Ambulatory Visit (INDEPENDENT_AMBULATORY_CARE_PROVIDER_SITE_OTHER): Payer: 59 | Admitting: Obstetrics and Gynecology

## 2019-07-26 ENCOUNTER — Ambulatory Visit (INDEPENDENT_AMBULATORY_CARE_PROVIDER_SITE_OTHER): Payer: 59

## 2019-07-26 VITALS — BP 122/84 | Ht 64.0 in | Wt 235.0 lb

## 2019-07-26 DIAGNOSIS — Z01419 Encounter for gynecological examination (general) (routine) without abnormal findings: Secondary | ICD-10-CM

## 2019-07-26 DIAGNOSIS — N950 Postmenopausal bleeding: Secondary | ICD-10-CM | POA: Diagnosis not present

## 2019-07-26 LAB — COLOGUARD: Cologuard: NEGATIVE

## 2019-07-26 NOTE — Progress Notes (Signed)
Gynecology Ultrasound Follow Up   Chief Complaint  Patient presents with  . Follow-up  Ultrasound follow up for postmenopausal bleeding   History of Present Illness: Patient is a 54 y.o. female who presents today for ultrasound evaluation of the above.  Ultrasound demonstrates the following findings Adnexa: no masses seen  Uterus: axial with endometrial stripe  2.4 mm Additional: no abnormalities  She denies any additional bleeding since her last visit.    Past Medical History:  Diagnosis Date  . No known health problems     Past Surgical History:  Procedure Laterality Date  . BREAST SURGERY    . LASIK     Family History  Problem Relation Age of Onset  . Diabetes Mellitus II Mother   . Heart disease Mother   . Diabetes Mellitus II Father   . Heart disease Father   . Hypertension Maternal Grandmother   . Throat cancer Paternal Uncle   . Breast cancer Neg Hx     Social History   Socioeconomic History  . Marital status: Married    Spouse name: Not on file  . Number of children: Not on file  . Years of education: Not on file  . Highest education level: Not on file  Occupational History  . Occupation: IT sales professional  Tobacco Use  . Smoking status: Never Smoker  . Smokeless tobacco: Never Used  Substance and Sexual Activity  . Alcohol use: No    Alcohol/week: 0.0 standard drinks  . Drug use: No  . Sexual activity: Not Currently    Birth control/protection: Surgical  Other Topics Concern  . Not on file  Social History Narrative  . Not on file   Social Determinants of Health   Financial Resource Strain:   . Difficulty of Paying Living Expenses:   Food Insecurity:   . Worried About Charity fundraiser in the Last Year:   . Arboriculturist in the Last Year:   Transportation Needs:   . Film/video editor (Medical):   Marland Kitchen Lack of Transportation (Non-Medical):   Physical Activity:   . Days of Exercise per Week:   . Minutes of Exercise per  Session:   Stress:   . Feeling of Stress :   Social Connections:   . Frequency of Communication with Friends and Family:   . Frequency of Social Gatherings with Friends and Family:   . Attends Religious Services:   . Active Member of Clubs or Organizations:   . Attends Archivist Meetings:   Marland Kitchen Marital Status:   Intimate Partner Violence:   . Fear of Current or Ex-Partner:   . Emotionally Abused:   Marland Kitchen Physically Abused:   . Sexually Abused:     No Known Allergies  Prior to Admission medications   Medication Sig Start Date End Date Taking? Authorizing Provider  AFLURIA PRESERVATIVE FREE 0.5 ML SUSY inject 0.5 milliliter intramuscularly 01/29/15   [provider]  Ascorbic Acid (VITAMIN C) 100 MG tablet Take 100 mg by mouth daily.    [provider]  diclofenac (VOLTAREN) 75 MG EC tablet Take 1 tablet (75 mg total) by mouth 2 (two) times daily. Patient not taking: Reported on 04/22/2017 02/22/15   Trula Slade, DPM    Physical Exam BP 122/84   Ht 5\' 4"  (1.626 m)   Wt 235 lb (106.6 kg)   BMI 40.34 kg/m    General: NAD HEENT: normocephalic, anicteric Pulmonary: No increased work of breathing Extremities:  no edema, erythema, or tenderness Neurologic: Grossly intact, normal gait Psychiatric: mood appropriate, affect full  Imaging Results US PELVIC COMPLETE WITH TRANSVAGINAL  Result Date: 07/26/2019 Patient Name: MADELON WELSCH DOB: 15-Jan-1966 MRN: [id number] ULTRASOUND REPORT Location: [facility] Date of Service: 07/26/2019 Indications: Postmenopausal bleeding Findings: The uterus is axial and measures 7.7 x 4.2 x 4.1 cm. Echo texture is homogenous without evidence of focal masses. The Endometrium measures 2.4 mm. Right Ovary is not visible. Left Ovary measures 2.0 x 1.7 x 1.0 cm. It is normal in appearance. Survey of the adnexa demonstrates no adnexal masses. There is no free fluid in the cul de sac. Impression: 1. Normal appearing uterus,  cervix and left ovary. 2. The right ovary is not visible. 3. Endometrial stripe is 2.4 mm Deanna Artis, RT The ultrasound images and findings were reviewed by me and I agree with the above report. Thomasene Mohair, MD, [facility], [facility] 07/26/2019 4:03 PM       Assessment: 54 y.o. G2P2002  1. Postmenopausal bleeding      Plan: Problem List Items Addressed This Visit    None    Visit Diagnoses    Postmenopausal bleeding    -  Primary     Reviewed ultrasound. Discussed that if she has an additional episode of vaginal bleeding that we would then perform an endometrial biopsy. All questions answered. Pap smear normal.   A total of 20 minutes were spent face-to-face with the patient as well as preparation, review, communication, and documentation during this encounter.    Thomasene Mohair, MD, [facility], [facility] 07/26/2019 4:10 PM

## 2019-09-19 ENCOUNTER — Telehealth: Payer: Self-pay

## 2019-09-19 NOTE — Telephone Encounter (Signed)
Pt calling for cologard results.  [phone]  Pt has recv'd letter that results have been sent to [place].  Adv we do not have results.  Will call [facility].

## 2019-09-21 NOTE — Telephone Encounter (Signed)
I called [employer]; adv pt had recv'd letter but we haven't recv'd results; spoke c Molli Hazard who will have results refaxed; states it was originally faxed on 08/01/19.  Pt aware of negative results.

## 2019-09-22 NOTE — Telephone Encounter (Signed)
Got results. SDJ reviewed. Scanned into chart

## 2019-12-20 ENCOUNTER — Other Ambulatory Visit: Payer: Self-pay | Admitting: Internal Medicine

## 2019-12-20 DIAGNOSIS — Z1231 Encounter for screening mammogram for malignant neoplasm of breast: Secondary | ICD-10-CM

## 2020-07-11 ENCOUNTER — Encounter: Payer: Self-pay | Admitting: Obstetrics and Gynecology

## 2020-07-11 ENCOUNTER — Other Ambulatory Visit: Payer: Self-pay

## 2020-07-11 ENCOUNTER — Ambulatory Visit (INDEPENDENT_AMBULATORY_CARE_PROVIDER_SITE_OTHER): Payer: 59 | Admitting: Obstetrics and Gynecology

## 2020-07-11 VITALS — BP 124/74 | Ht 64.0 in | Wt 242.0 lb

## 2020-07-11 DIAGNOSIS — Z01419 Encounter for gynecological examination (general) (routine) without abnormal findings: Secondary | ICD-10-CM

## 2020-07-11 DIAGNOSIS — R2232 Localized swelling, mass and lump, left upper limb: Secondary | ICD-10-CM | POA: Diagnosis not present

## 2020-07-11 DIAGNOSIS — Z1339 Encounter for screening examination for other mental health and behavioral disorders: Secondary | ICD-10-CM | POA: Diagnosis not present

## 2020-07-11 DIAGNOSIS — Z1331 Encounter for screening for depression: Secondary | ICD-10-CM

## 2020-07-11 NOTE — Progress Notes (Signed)
Routine Annual Gynecology Examination   PCP: Conard Novak, MD  Chief Complaint  Patient presents with  . Annual Exam   History of Present Illness: Patient is a 55 y.o. [id number] presents for annual exam. The patient has no complaints today.   Menopausal bleeding: She denies vaginal bleeding.   Menopausal symptoms: reports mild hot flushes. Less than before.   Breast symptoms: denies  Last pap smear: 1 years ago.  Result Normal  Last mammogram: 3 years ago.  Result Normal   Colorectal cancer screen: Cologuard negative 07/2019  She is going through some life stressors at home.  She is also struggling with weight loss. She is seeing some stressful issues at work.    Past Medical History:  Diagnosis Date  . GERD (gastroesophageal reflux disease)     Past Surgical History:  Procedure Laterality Date  . BREAST SURGERY    . LASIK      Prior to Admission medications   Medication Sig Start Date End Date Taking? Authorizing Provider  Ascorbic Acid (VITAMIN C) 100 MG tablet Take 100 mg by mouth daily.   Yes [provider]  pantoprazole (PROTONIX) 40 MG tablet Take 1 tablet by mouth at bedtime. 05/26/20  Yes [provider]  MVIs  Allergies:  No Known Allergies  Obstetric History: [id number]  Social History   Socioeconomic History  . Marital status: Married    Spouse name: Not on file  . Number of children: Not on file  . Years of education: Not on file  . Highest education level: Not on file  Occupational History  . Occupation: Biochemist, clinical  Tobacco Use  . Smoking status: Never Smoker  . Smokeless tobacco: Never Used  Vaping Use  . Vaping Use: Never used  Substance and Sexual Activity  . Alcohol use: No    Alcohol/week: 0.0 standard drinks  . Drug use: No  . Sexual activity: Yes    Birth control/protection: Surgical  Other Topics Concern  . Not on file  Social History Narrative  . Not on file   Social Determinants of Health    Financial Resource Strain: Not on file  Food Insecurity: Not on file  Transportation Needs: Not on file  Physical Activity: Not on file  Stress: Not on file  Social Connections: Not on file  Intimate Partner Violence: Not on file    Family History  Problem Relation Age of Onset  . Diabetes Mellitus II Mother   . Heart disease Mother   . Diabetes Mellitus II Father   . Heart disease Father   . Hypertension Maternal Grandmother   . Throat cancer Paternal Uncle   . Breast cancer Neg Hx     Review of Systems  Constitutional: Negative.   HENT: Negative.   Eyes: Negative.   Respiratory: Negative.   Cardiovascular: Negative.   Gastrointestinal: Negative for abdominal pain, blood in stool, constipation, diarrhea, heartburn, melena, nausea and vomiting.  Genitourinary: Negative.   Musculoskeletal: Positive for joint pain. Negative for back pain, falls, myalgias and neck pain.  Skin: Negative.   Neurological: Negative.   Endo/Heme/Allergies: Positive for environmental allergies. Negative for polydipsia. Does not bruise/bleed easily.  Psychiatric/Behavioral: Negative.      Physical Exam Vitals: BP 124/74   Ht 5\' 4"  (1.626 m)   Wt 242 lb (109.8 kg)   BMI 41.54 kg/m   Physical Exam Constitutional:      General: She is not in acute distress.    Appearance: Normal appearance.  She is well-developed.  Genitourinary:     Vulva and bladder normal.     Right Labia: No rash, tenderness, lesions, skin changes or Bartholin's cyst.    Left Labia: No tenderness, lesions, skin changes, Bartholin's cyst or rash.    No inguinal adenopathy present in the right or left side.    Pelvic Tanner Score: 5/5.    No vaginal discharge, erythema, tenderness or bleeding.      Right Adnexa: not tender, not full and no mass present.    Left Adnexa: not tender, not full and no mass present.    No cervical motion tenderness, discharge, lesion or polyp.     Uterus is not enlarged or tender.     No  uterine mass detected.    Pelvic exam was performed with patient in the lithotomy position.  Breasts:     Right: No inverted nipple, mass, nipple discharge, skin change or tenderness.     Left: Mass (left axillary mass that may represent dense breast tissue. This tissue is not present on the right. The tissue is about 10 cm in length, nontender, not fixed, with no overlying skin changes.) present. No inverted nipple, nipple discharge, skin change or tenderness.    HENT:     Head: Normocephalic and atraumatic.  Eyes:     General: No scleral icterus.    Conjunctiva/sclera: Conjunctivae normal.  Neck:     Thyroid: No thyromegaly.  Cardiovascular:     Rate and Rhythm: Normal rate and regular rhythm.     Heart sounds: No murmur heard. No friction rub. No gallop.   Pulmonary:     Effort: Pulmonary effort is normal. No respiratory distress.     Breath sounds: Normal breath sounds. No wheezing or rales.  Chest:    Abdominal:     General: Bowel sounds are normal. There is no distension.     Palpations: Abdomen is soft. There is no mass.     Tenderness: There is no abdominal tenderness. There is no guarding or rebound.     Hernia: There is no hernia in the left inguinal area or right inguinal area.  Musculoskeletal:        General: No swelling or tenderness. Normal range of motion.     Cervical back: Normal range of motion and neck supple.  Lymphadenopathy:     Cervical: No cervical adenopathy.     Lower Body: No right inguinal and no right inguinal adenopathy. No left inguinal and no left inguinal adenopathy.  Neurological:     General: No focal deficit present.     Mental Status: She is alert and oriented to person, place, and time.     Cranial Nerves: No cranial nerve deficit.  Skin:    General: Skin is warm and dry.     Findings: No erythema or rash.  Psychiatric:        Mood and Affect: Mood normal.        Behavior: Behavior normal.        Judgment: Judgment normal.       Female chaperone present for pelvic and breast  portions of the physical exam  Results: AUDIT Questionnaire (screen for alcoholism): 0 PHQ-9: 10   Assessment and Plan:  55 y.o. G3P2002 female here for routine annual gynecologic examination  Plan: Problem List Items Addressed This Visit   None   Visit Diagnoses    Women's annual routine gynecological examination    -  Primary   Relevant Orders  MM DIAG BREAST TOMO BILATERAL   US BREAST LTD UNI LEFT INC AXILLA   US BREAST LTD UNI RIGHT INC AXILLA   Screening for depression       Screening for alcoholism       Axillary mass, left       Relevant Orders   MM DIAG BREAST TOMO BILATERAL   US BREAST LTD UNI LEFT INC AXILLA   US BREAST LTD UNI RIGHT INC AXILLA      Screening: -- Blood pressure screen normal -- Colonoscopy - s/p normal Cologuard -- Mammogram - Will order diagnostic mammogram based on findings from today. -- Weight screening: obese: discussed management options, including lifestyle, dietary, and exercise. -- Depression screening intermediate(PHQ-9). After discussion, appears to be situation. Continue to monitor.  -- Nutrition: normal -- cholesterol screening: per PCP -- osteoporosis screening: not due -- tobacco screening: not using -- alcohol screening: AUDIT questionnaire indicates low-risk usage. -- family history of breast cancer screening: done. not at high risk. -- no evidence of domestic violence or intimate partner violence. -- STD screening: gonorrhea/chlamydia NAAT not collected per patient request. -- pap smear not collected per ASCCP guidelines   Diagnostic mammo due to left axillary mass.  Soft, mobile, non-tender, no skin changes.  Thomasene Mohair, MD 07/11/2020 4:09 PM

## 2020-07-12 ENCOUNTER — Encounter: Payer: Self-pay | Admitting: Obstetrics and Gynecology

## 2020-07-16 ENCOUNTER — Other Ambulatory Visit: Payer: Self-pay | Admitting: *Deleted

## 2020-07-16 ENCOUNTER — Inpatient Hospital Stay
Admission: RE | Admit: 2020-07-16 | Discharge: 2020-07-16 | Disposition: A | Discharge status: Home / Self Care | Payer: Self-pay | Source: Ambulatory Visit | Attending: *Deleted | Admitting: *Deleted

## 2020-07-16 DIAGNOSIS — Z1231 Encounter for screening mammogram for malignant neoplasm of breast: Secondary | ICD-10-CM

## 2020-07-19 ENCOUNTER — Ambulatory Visit
Admission: RE | Admit: 2020-07-19 | Discharge: 2020-07-19 | Disposition: A | Discharge status: Home / Self Care | Payer: 59 | Source: Ambulatory Visit | Attending: Obstetrics and Gynecology | Admitting: Obstetrics and Gynecology

## 2020-07-19 ENCOUNTER — Other Ambulatory Visit: Payer: Self-pay

## 2020-07-19 DIAGNOSIS — Z01419 Encounter for gynecological examination (general) (routine) without abnormal findings: Secondary | ICD-10-CM | POA: Insufficient documentation

## 2020-07-19 DIAGNOSIS — R2232 Localized swelling, mass and lump, left upper limb: Secondary | ICD-10-CM

## 2021-08-08 ENCOUNTER — Other Ambulatory Visit: Payer: Self-pay | Admitting: Physician Assistant

## 2021-08-08 DIAGNOSIS — M4802 Spinal stenosis, cervical region: Secondary | ICD-10-CM

## 2021-08-08 DIAGNOSIS — M5412 Radiculopathy, cervical region: Secondary | ICD-10-CM

## 2021-08-15 ENCOUNTER — Ambulatory Visit
Admission: RE | Admit: 2021-08-15 | Discharge: 2021-08-15 | Disposition: A | Discharge status: Home / Self Care | Payer: 59 | Source: Ambulatory Visit | Attending: Physician Assistant | Admitting: Physician Assistant

## 2021-08-15 DIAGNOSIS — M4802 Spinal stenosis, cervical region: Secondary | ICD-10-CM | POA: Insufficient documentation

## 2021-08-15 DIAGNOSIS — M5412 Radiculopathy, cervical region: Secondary | ICD-10-CM | POA: Diagnosis present

## 2021-12-10 ENCOUNTER — Encounter: Payer: Self-pay | Admitting: Neurosurgery

## 2021-12-10 ENCOUNTER — Ambulatory Visit (INDEPENDENT_AMBULATORY_CARE_PROVIDER_SITE_OTHER): Payer: 59 | Admitting: Neurosurgery

## 2021-12-10 VITALS — BP 132/78 | Ht 64.0 in | Wt 242.0 lb

## 2021-12-10 DIAGNOSIS — M5412 Radiculopathy, cervical region: Secondary | ICD-10-CM

## 2021-12-10 NOTE — Progress Notes (Signed)
Referring Physician:  Venetia Night, MD [address] [place],  [place] [address]  Primary Physician:  Elizabeth Baas, MD  History of Present Illness: 12/10/2021 [name] is here today with a chief complaint of left arm pain.  She is doing much better than she was previously.  She is only occasionally having left arm pain.  She has been doing physical therapy and exercises.  Traction has helped her a lot.  09/05/2021 [name] is here today with a chief complaint of neck pain that radiates into the left shoulder, arm and hand. She also has constant numbness and tingling in the arm.  She has had symptoms for approximately 5 months. She reports aching and numbness that goes down her arm into her forearm and her second through fourth finger. It can be shocklike at times and as bad as 7 out of 10. Exercise and lifting items makes it worse. Sitting and computer makes it worse. Laying down and resting makes it better. She is been seen by chiropractor without long-term help. She denies weakness. Bowel/Bladder Dysfunction: none  Conservative measures:  Has seen a Chiropractor and also had massage Physical therapy: has not participated in Multimodal medical therapy including regular antiinflammatories: flexeril, naproxen Injections: has not received epidural steroid injections  Past Surgery: denies  Elizabeth Rivers has no symptoms of cervical myelopathy.  The symptoms are causing a significant impact on the patient's life.   Review of Systems:  A 10 point review of systems is negative, except for the pertinent positives and negatives detailed in the HPI.  Past Medical History: Past Medical History:  Diagnosis Date   GERD (gastroesophageal reflux disease)     Past Surgical History: Past Surgical History:  Procedure Laterality Date   BREAST SURGERY Left    extra nipple removed below left breast in teens per pt   LASIK       Allergies: Allergies as of 12/10/2021   (No Known Allergies)    Medications: No outpatient medications have been marked as taking for the 12/10/21 encounter (Appointment) with Elizabeth Night, MD.    Social History: Social History   Tobacco Use   Smoking status: Never   Smokeless tobacco: Never  Vaping Use   Vaping Use: Never used  Substance Use Topics   Alcohol use: No    Alcohol/week: 0.0 standard drinks of alcohol   Drug use: No    Family Medical History: Family History  Problem Relation Age of Onset   Diabetes Mellitus II Mother    Heart disease Mother    Diabetes Mellitus II Father    Heart disease Father    Hypertension Maternal Grandmother    Throat cancer Paternal Uncle    Breast cancer Neg Hx     Physical Examination: There were no vitals filed for this visit.  General: Patient is well developed, well nourished, calm, collected, and in no apparent distress. Attention to examination is appropriate.  Neck:   Supple.  Full range of motion.  Respiratory: Patient is breathing without any difficulty.   NEUROLOGICAL:     Awake, alert, oriented to person, place, and time.  Speech is clear and fluent. Fund of knowledge is appropriate.   Cranial Nerves: Pupils equal round and reactive to light.  Facial tone is symmetric.  Facial sensation is symmetric. Shoulder shrug is symmetric. Tongue protrusion is midline.  There is no pronator drift.  ROM of spine: full.    Strength: Side Biceps Triceps Deltoid Interossei  Grip Wrist Ext. Wrist Flex.  R 5 5 5 5 5 5 5   L 5 5 5 5 5 5 5    Side Iliopsoas Quads Hamstring PF DF EHL  R 5 5 5 5 5 5   L 5 5 5 5 5 5    Reflexes are 1+ and symmetric at the biceps, triceps, brachioradialis, patella and achilles.   Hoffman's is absent.  Clonus is not present.  Toes are down-going.  Bilateral upper and lower extremity sensation is intact to light touch.    No evidence of dysmetria noted.  Gait is normal.      Medical  Decision Making  Imaging: MRI C spine 08/16/21 IMPRESSION: 1. 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 Query left C7 radiculitis. 2. Broad-based lobulated disc bulge with endplate spurring at C5-6 with resultant mild canal and moderate right C6 foraminal stenosis. 3. Additional more mild degenerative changes as detailed above. No other significant stenosis or overt neural impingement.     Electronically Signed   By: M.D.   On: 08/16/2021 06:42  I have personally reviewed the images and agree with the above interpretation.  Assessment and Plan: [name] is a pleasant 56 y.o. female with left C7 radiculopathy.  Her symptoms are much improved compared to when I saw her previously.  At this point, I recommend that she continue exercises from her physical therapist for now.  If her symptoms continue to slowly improve, we will hold off on any surgical intervention.  I will see her on an as-needed basis if her symptoms return.  I spent a total of 10 minutes in both face-to-face and non-face-to-face activities for this visit on the date of this encounter.  Thank you for involving me in the care of this patient.      Elizabeth Yeo K. Rise Mu MD, [facility] Neurosurgery

## 2021-12-16 ENCOUNTER — Other Ambulatory Visit: Payer: Self-pay

## 2021-12-16 ENCOUNTER — Ambulatory Visit
Admission: RE | Admit: 2021-12-16 | Discharge: 2021-12-16 | Disposition: A | Discharge status: Home / Self Care | Payer: Self-pay | Source: Ambulatory Visit | Attending: Neurosurgery | Admitting: Neurosurgery

## 2021-12-16 DIAGNOSIS — M5412 Radiculopathy, cervical region: Secondary | ICD-10-CM

## 2022-01-16 ENCOUNTER — Telehealth: Payer: Self-pay

## 2022-01-16 NOTE — Telephone Encounter (Signed)
-----  Message from Peggyann Shoals sent at 01/16/2022  3:59 PM EDT ----- Regarding: symptoms increased Contact: [phone] Patient is calling that PT was helping but now her symptoms seem to have increased. She has met her deductible for the year and is thinking of having her surgery. Can you give her CPT codes so she can call her insurance and make sure that it will be in-network and paid for.  Her PCP had prescribed Gabapentin do you think it would be ok for her to take in the mean time while she decides to go forward with surgery or not?

## 2022-01-17 NOTE — Telephone Encounter (Signed)
I notified Ms Nardelli of Dr Rhea Bleacher response. She wants to contact her insurance before making any decisions.

## 2022-02-14 ENCOUNTER — Telehealth: Payer: Self-pay

## 2022-02-14 NOTE — Telephone Encounter (Signed)
-----  Message from [place] sent at 02/13/2022  2:07 PM EST ----- Regarding: Prior Auth? Needs a prior auth sent for the hospital part of the surgery. And needs to know if the surgery is possible to be scheduled before the end of the year because she has met her deductible.

## 2022-02-14 NOTE — Telephone Encounter (Signed)
Left message to return call to discuss dates. She needs to come back in to see him to discuss surgery b/c at her last visit she was feeling better. OK to use a new pt slot if needed per [facility].

## 2022-02-17 NOTE — Telephone Encounter (Signed)
I spoke with Elizabeth Rivers. I scheduled her an appointment tomorrow to discuss surgery

## 2022-02-17 NOTE — Telephone Encounter (Signed)
She is going to contact [facility] to get copies of her PT notes sent to [place] or she will bring them to her appt

## 2022-02-18 ENCOUNTER — Other Ambulatory Visit: Payer: Self-pay

## 2022-02-18 ENCOUNTER — Encounter: Payer: Self-pay | Admitting: Neurosurgery

## 2022-02-18 ENCOUNTER — Ambulatory Visit (INDEPENDENT_AMBULATORY_CARE_PROVIDER_SITE_OTHER): Payer: 59 | Admitting: Neurosurgery

## 2022-02-18 VITALS — BP 124/82 | Ht 64.0 in | Wt 242.0 lb

## 2022-02-18 DIAGNOSIS — Z01818 Encounter for other preprocedural examination: Secondary | ICD-10-CM

## 2022-02-18 DIAGNOSIS — M5412 Radiculopathy, cervical region: Secondary | ICD-10-CM

## 2022-02-18 NOTE — Progress Notes (Signed)
Referring Physician:  Enid Baas, MD [address] [place],  [place] [address]  Primary Physician:  Enid Baas, MD  History of Present Illness: 02/18/2022 Patient continues to have left arm pain.  She was doing somewhat better at the last visit, but has had return of pain down her left arm that is now interfering with her day-to-day life.  She has done physical therapy for considerable period of time.  12/10/2021 [name] is here today with a chief complaint of left arm pain.  She is doing much better than she was previously.  She is only occasionally having left arm pain.  She has been doing physical therapy and exercises.  Traction has helped her a lot.  09/05/2021 [name] is here today with a chief complaint of neck pain that radiates into the left shoulder, arm and hand. She also has constant numbness and tingling in the arm.  She has had symptoms for approximately 5 months. She reports aching and numbness that goes down her arm into her forearm and her second through fourth finger. It can be shocklike at times and as bad as 7 out of 10. Exercise and lifting items makes it worse. Sitting and computer makes it worse. Laying down and resting makes it better. She is been seen by chiropractor without long-term help. She denies weakness. Bowel/Bladder Dysfunction: none  Conservative measures:  Has seen a Chiropractor and also had massage Physical therapy: has not participated in Multimodal medical therapy including regular antiinflammatories: flexeril, naproxen Injections: has not received epidural steroid injections  Past Surgery: denies  Elizabeth Rivers has no symptoms of cervical myelopathy.  The symptoms are causing a significant impact on the patient's life.   Review of Systems:  A 10 point review of systems is negative, except for the pertinent positives and negatives detailed in the HPI.  Past Medical History: Past Medical History:   Diagnosis Date   GERD (gastroesophageal reflux disease)     Past Surgical History: Past Surgical History:  Procedure Laterality Date   BREAST SURGERY Left    extra nipple removed below left breast in teens per pt   LASIK      Allergies: Allergies as of 02/18/2022   (No Known Allergies)    Medications: Current Meds  Medication Sig   acetaminophen (TYLENOL) 325 MG tablet Take 650 mg by mouth every 6 (six) hours as needed.   Bacillus Coagulans-Inulin (PROBIOTIC) 1-250 BILLION-MG CAPS Take 1 capsule by mouth daily.   diclofenac (VOLTAREN) 75 MG EC tablet Take 1 tablet (75 mg total) by mouth 2 (two) times daily.   gabapentin (NEURONTIN) 100 MG capsule Take 100 mg by mouth 2 (two) times daily.   naproxen (NAPROSYN) 500 MG tablet TAKE 1 TABLET (500 MG TOTAL) BY MOUTH 2 (TWO) TIMES DAILY AS NEEDED FOR UP TO 30 DAYS    Social History: Social History   Tobacco Use   Smoking status: Never   Smokeless tobacco: Never  Vaping Use   Vaping Use: Never used  Substance Use Topics   Alcohol use: No    Alcohol/week: 0.0 standard drinks of alcohol   Drug use: No    Family Medical History: Family History  Problem Relation Age of Onset   Diabetes Mellitus II Mother    Heart disease Mother    Diabetes Mellitus II Father    Heart disease Father    Hypertension Maternal Grandmother    Throat cancer Paternal Uncle    Breast cancer  Neg Hx     Physical Examination: Vitals:   02/18/22 0923  BP: 124/82    General: Patient is well developed, well nourished, calm, collected, and in no apparent distress. Attention to examination is appropriate.  Neck:   Supple.  Full range of motion.  Respiratory: Patient is breathing without any difficulty.   NEUROLOGICAL:     Awake, alert, oriented to person, place, and time.  Speech is clear and fluent. Fund of knowledge is appropriate.   Cranial Nerves: Pupils equal round and reactive to light.  Facial tone is symmetric.  Facial sensation is  symmetric. Shoulder shrug is symmetric. Tongue protrusion is midline.  There is no pronator drift.  ROM of spine: full.    Strength: Side Biceps Triceps Deltoid Interossei Grip Wrist Ext. Wrist Flex.  R 5 5 5 5 5 5 5   L 5 5 5 5 5 5 5    Side Iliopsoas Quads Hamstring PF DF EHL  R 5 5 5 5 5 5   L 5 5 5 5 5 5    Reflexes are 1+ and symmetric at the biceps, triceps, brachioradialis, patella and achilles.   Hoffman's is absent.  Clonus is not present.  Toes are down-going.  Bilateral upper and lower extremity sensation is intact to light touch.    No evidence of dysmetria noted.  Gait is normal.      Medical Decision Making  Imaging: MRI C spine 08/16/21 IMPRESSION: 1. 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 Query left C7 radiculitis. 2. Broad-based lobulated disc bulge with endplate spurring at C5-6 with resultant mild canal and moderate right C6 foraminal stenosis. 3. Additional more mild degenerative changes as detailed above. No other significant stenosis or overt neural impingement.     Electronically Signed   By: M.D.   On: 08/16/2021 06:42  I have personally reviewed the images and agree with the above interpretation.  Assessment and Plan: Elizabeth Rivers is a pleasant 56 y.o. female with left C7 radiculopathy.  Her symptoms are currently causing her significant issues.  She has tried and failed physical therapy.  At this point, I have recommended that she consider surgical intervention with a C6-7 arthroplasty.  No further conservative management is indicated.    I discussed the planned procedure at length with the patient, including the risks, benefits, alternatives, and indications. The risks discussed include but are not limited to bleeding, infection, need for reoperation, spinal fluid leak, stroke, vision loss, anesthetic complication, coma,  paralysis, and even death. We also discussed the possibility of post-operative dysphagia, vocal cord paralysis, and the risk of adjacent segment disease in the future. I also described in detail that improvement was not guaranteed.  The patient expressed understanding of these risks, and asked that we proceed with surgery. I described the surgery in layman's terms, and gave ample opportunity for questions, which were answered to the best of my ability.   I spent a total of 20 minutes in both face-to-face and non-face-to-face activities for this visit on the date of this encounter.  Thank you for involving me in the care of this patient.      Montgomery Rothlisberger K. Rise Mu MD, [facility] Neurosurgery

## 2022-02-18 NOTE — Patient Instructions (Signed)
Please see below for information in regards to your upcoming surgery:  Planned surgery: C6-7 arthroplasty   Surgery date: 03/17/22 - you will find out your arrival time the business day before your surgery.   Pre-op appointment at [facility] Pre-admit Testing: we will call you with a date/time for this. Pre-admit testing is located on the first floor of the [facility], [address] [facility] [address], [address]. Please bring all prescriptions in the original prescription bottles to your appointment, even if you have reviewed medications by phone with a pharmacy representative. Pre-op labs may be done at your pre-op appointment. You are not required to fast for these labs. Should you need to change your pre-op appointment, please call Pre-admit testing at [phone].    Because you are having an arthroplasty: for appointments after your 2 week follow-up: please arrive at the [facility] outpatient imaging center ([address], [address], [place]) or [facility] one hour prior to your appointment for x-rays. This applies to every appointment after your 2 week follow-up. Failure to do so may result in your appointment being rescheduled.   If you have FMLA/disability paperwork, please drop it off or fax it to [phone], attention [place].   We can be reached by phone or mychart 8am-4pm, Monday-Friday. If you have any questions/concerns before or after surgery, you can reach [place] at [phone], or you can send a mychart message. If you have a concern after hours that cannot wait until normal business hours, you can call [phone] and ask to page the neurosurgeon on call for [place].    Appointments/FMLA & disability paperwork: [place] Nurse: Royston Cowper  Medical assistant: Irving Burton Physician Assistant's: Manning Charity & Drake Leach Surgeon: Venetia Night, MD

## 2022-02-18 NOTE — H&P (View-Only) (Signed)
Referring Physician:  Enid Baas, MD [address] [place],  [place] [address]  Primary Physician:  Enid Baas, MD  History of Present Illness: 02/18/2022 Patient continues to have left arm pain.  She was doing somewhat better at the last visit, but has had return of pain down her left arm that is now interfering with her day-to-day life.  She has done physical therapy for considerable period of time.  12/10/2021 Elizabeth Rivers is here today with a chief complaint of left arm pain.  She is doing much better than she was previously.  She is only occasionally having left arm pain.  She has been doing physical therapy and exercises.  Traction has helped her a lot.  09/05/2021 Elizabeth Rivers is here today with a chief complaint of neck pain that radiates into the left shoulder, arm and hand. She also has constant numbness and tingling in the arm.  She has had symptoms for approximately 5 months. She reports aching and numbness that goes down her arm into her forearm and her second through fourth finger. It can be shocklike at times and as bad as 7 out of 10. Exercise and lifting items makes it worse. Sitting and computer makes it worse. Laying down and resting makes it better. She is been seen by chiropractor without long-term help. She denies weakness. Bowel/Bladder Dysfunction: none  Conservative measures:  Has seen a Chiropractor and also had massage Physical therapy: has not participated in Multimodal medical therapy including regular antiinflammatories: flexeril, naproxen Injections: has not received epidural steroid injections  Past Surgery: denies  Elizabeth Rivers has no symptoms of cervical myelopathy.  The symptoms are causing a significant impact on the patient's life.   Review of Systems:  A 10 point review of systems is negative, except for the pertinent positives and negatives detailed in the HPI.  Past Medical History: Past Medical History:   Diagnosis Date   GERD (gastroesophageal reflux disease)     Past Surgical History: Past Surgical History:  Procedure Laterality Date   BREAST SURGERY Left    extra nipple removed below left breast in teens per pt   LASIK      Allergies: Allergies as of 02/18/2022   (No Known Allergies)    Medications: Current Meds  Medication Sig   acetaminophen (TYLENOL) 325 MG tablet Take 650 mg by mouth every 6 (six) hours as needed.   Bacillus Coagulans-Inulin (PROBIOTIC) 1-250 BILLION-MG CAPS Take 1 capsule by mouth daily.   diclofenac (VOLTAREN) 75 MG EC tablet Take 1 tablet (75 mg total) by mouth 2 (two) times daily.   gabapentin (NEURONTIN) 100 MG capsule Take 100 mg by mouth 2 (two) times daily.   naproxen (NAPROSYN) 500 MG tablet TAKE 1 TABLET (500 MG TOTAL) BY MOUTH 2 (TWO) TIMES DAILY AS NEEDED FOR UP TO 30 DAYS    Social History: Social History   Tobacco Use   Smoking status: Never   Smokeless tobacco: Never  Vaping Use   Vaping Use: Never used  Substance Use Topics   Alcohol use: No    Alcohol/week: 0.0 standard drinks of alcohol   Drug use: No    Family Medical History: Family History  Problem Relation Age of Onset   Diabetes Mellitus II Mother    Heart disease Mother    Diabetes Mellitus II Father    Heart disease Father    Hypertension Maternal Grandmother    Throat cancer Paternal Uncle    Breast cancer  Neg Hx     Physical Examination: Vitals:   02/18/22 0923  BP: 124/82    General: Patient is well developed, well nourished, calm, collected, and in no apparent distress. Attention to examination is appropriate.  Neck:   Supple.  Full range of motion.  Respiratory: Patient is breathing without any difficulty.   NEUROLOGICAL:     Awake, alert, oriented to person, place, and time.  Speech is clear and fluent. Fund of knowledge is appropriate.   Cranial Nerves: Pupils equal round and reactive to light.  Facial tone is symmetric.  Facial sensation is  symmetric. Shoulder shrug is symmetric. Tongue protrusion is midline.  There is no pronator drift.  ROM of spine: full.    Strength: Side Biceps Triceps Deltoid Interossei Grip Wrist Ext. Wrist Flex.  R 5 5 5 5 5 5 5   L 5 5 5 5 5 5 5    Side Iliopsoas Quads Hamstring PF DF EHL  R 5 5 5 5 5 5   L 5 5 5 5 5 5    Reflexes are 1+ and symmetric at the biceps, triceps, brachioradialis, patella and achilles.   Hoffman's is absent.  Clonus is not present.  Toes are down-going.  Bilateral upper and lower extremity sensation is intact to light touch.    No evidence of dysmetria noted.  Gait is normal.      Medical Decision Making  Imaging: MRI C spine 08/16/21 IMPRESSION: 1. 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 Query left C7 radiculitis. 2. Broad-based lobulated disc bulge with endplate spurring at C5-6 with resultant mild canal and moderate right C6 foraminal stenosis. 3. Additional more mild degenerative changes as detailed above. No other significant stenosis or overt neural impingement.     Electronically Signed   By: M.D.   On: 08/16/2021 06:42  I have personally reviewed the images and agree with the above interpretation.  Assessment and Plan: Elizabeth Rivers is a pleasant 56 y.o. female with left C7 radiculopathy.  Her symptoms are currently causing her significant issues.  She has tried and failed physical therapy.  At this point, I have recommended that she consider surgical intervention with a C6-7 arthroplasty.  No further conservative management is indicated.    I discussed the planned procedure at length with the patient, including the risks, benefits, alternatives, and indications. The risks discussed include but are not limited to bleeding, infection, need for reoperation, spinal fluid leak, stroke, vision loss, anesthetic complication, coma,  paralysis, and even death. We also discussed the possibility of post-operative dysphagia, vocal cord paralysis, and the risk of adjacent segment disease in the future. I also described in detail that improvement was not guaranteed.  The patient expressed understanding of these risks, and asked that we proceed with surgery. I described the surgery in layman's terms, and gave ample opportunity for questions, which were answered to the best of my ability.   I spent a total of 20 minutes in both face-to-face and non-face-to-face activities for this visit on the date of this encounter.  Thank you for involving me in the care of this patient.      Aujanae Mccullum K. Rise Mu MD, [facility] Neurosurgery

## 2022-02-21 NOTE — Telephone Encounter (Signed)
fyi

## 2022-02-21 NOTE — Telephone Encounter (Signed)
Notes on your desk

## 2022-03-07 ENCOUNTER — Encounter: Payer: Self-pay | Admitting: *Deleted

## 2022-03-07 ENCOUNTER — Other Ambulatory Visit: Payer: Self-pay

## 2022-03-07 ENCOUNTER — Encounter
Admission: RE | Admit: 2022-03-07 | Discharge: 2022-03-07 | Disposition: A | Discharge status: Home / Self Care | Payer: 59 | Source: Ambulatory Visit | Attending: Neurosurgery | Admitting: Neurosurgery

## 2022-03-07 VITALS — BP 119/65 | HR 89 | Temp 97.7°F | Resp 18 | Ht 64.0 in | Wt 243.6 lb

## 2022-03-07 DIAGNOSIS — Z01812 Encounter for preprocedural laboratory examination: Secondary | ICD-10-CM

## 2022-03-07 HISTORY — DX: Prediabetes: R73.03

## 2022-03-07 LAB — BASIC METABOLIC PANEL
Anion gap: 8 (ref 5–15)
BUN: 15 mg/dL (ref 6–20)
CO2: 24 mmol/L (ref 22–32)
Calcium: 8.8 mg/dL — ABNORMAL LOW (ref 8.9–10.3)
Chloride: 109 mmol/L (ref 98–111)
Creatinine, Ser: 0.89 mg/dL (ref 0.44–1.00)
GFR, Estimated: >60 mL/min (ref >60)
Glucose, Bld: 133 mg/dL — ABNORMAL HIGH (ref 70–99)
Potassium: 3.8 mmol/L (ref 3.5–5.1)
Sodium: 141 mmol/L (ref 135–145)

## 2022-03-07 LAB — CBC
HCT: 33.9 % — ABNORMAL LOW (ref 36.0–46.0)
Hemoglobin: 10 g/dL — ABNORMAL LOW (ref 12.0–15.0)
MCH: 22 pg — ABNORMAL LOW (ref 26.0–34.0)
MCHC: 29.5 g/dL — ABNORMAL LOW (ref 30.0–36.0)
MCV: 74.5 fL — ABNORMAL LOW (ref 80.0–100.0)
Platelets: 338 10*3/uL (ref 150–400)
RBC: 4.55 MIL/uL (ref 3.87–5.11)
RDW: 18.1 % — ABNORMAL HIGH (ref 11.5–15.5)
WBC: 6.5 10*3/uL (ref 4.0–10.5)
nRBC: 0 % (ref 0.0–0.2)

## 2022-03-07 LAB — SURGICAL PCR SCREEN
MRSA, PCR: NEGATIVE
Staphylococcus aureus: NEGATIVE

## 2022-03-07 LAB — TYPE AND SCREEN
ABO/RH(D): O POS
Antibody Screen: NEGATIVE

## 2022-03-07 NOTE — Patient Instructions (Addendum)
Your procedure is scheduled on: Monday March 17, 2022. Report to Day Surgery inside [facility] 2nd floor, stop by registration desk before getting on elevator.  To find out your arrival time please call [phone] between 1PM - 3PM on Friday March 13, 2022.  Remember: Instructions that are not followed completely may result in serious medical risk,  up to and including death, or upon the discretion of your surgeon and anesthesiologist your  surgery may need to be rescheduled.     _X__ 1. Do not eat food after midnight the night before your procedure.                 No chewing gum or hard candies. You may drink clear liquids up to 2 hours                 before you are scheduled to arrive for your surgery- DO not drink clear                 liquids within 2 hours of the start of your surgery.                 Clear Liquids include:  water, apple juice without pulp, clear Gatorade, G2 or                  Gatorade Zero (avoid Red/Purple/Blue), Black Coffee or Tea (Do not add                 anything to coffee or tea).  __X__2.  On the morning of surgery brush your teeth with toothpaste and water, you                may rinse your mouth with mouthwash if you wish.  Do not swallow any toothpaste or mouthwash.     _X__ 3.  No Alcohol for 24 hours before or after surgery.   _X__ 4.  Do Not Smoke or use e-cigarettes For 24 Hours Prior to Your Surgery.                 Do not use any chewable tobacco products for at least 6 hours prior to                 Surgery.  _X__  5.  Do not use any recreational drugs (marijuana, cocaine, heroin, ecstasy, MDMA or other)                For at least one week prior to your surgery.  Combination of these drugs with anesthesia                May have life threatening results.  ____  6.  Bring all medications with you on the day of surgery if instructed.   __X__  7.  Notify your doctor if there is any change in your medical  condition      (cold, fever, infections).     Do not wear jewelry, make-up, hairpins, clips or nail polish. Do not wear lotions, powders, or perfumes. You may wear deodorant. Do not shave 48 hours prior to surgery. Men may shave face and neck. Do not bring valuables to the hospital.    [facility] is not responsible for any belongings or valuables.  Contacts, dentures or bridgework may not be worn into surgery. Leave your suitcase in the car. After surgery it may be brought to your room. For patients admitted to the hospital, discharge time is  determined by your treatment team.   Patients discharged the day of surgery will not be allowed to drive home.   Make arrangements for someone to be with you for the first 24 hours of your Same Day Discharge.   __X__ Take these medicines the morning of surgery with A SIP OF WATER:    1. pantoprazole (PROTONIX) 40 MG (the night before and the morning of surgery)  2.   3.   4.  5.  6.  ____ Fleet Enema (as directed)   __X__ Use CHG Soap (or wipes) as directed  ____ Use Benzoyl Peroxide Gel as instructed  ____ Use inhalers on the day of surgery  ____ Stop metformin 2 days prior to surgery    ____ Take 1/2 of usual insulin dose the night before surgery. No insulin the morning          of surgery.   ____ Call your PCP, cardiologist, or Pulmonologist if taking Coumadin/Plavix/aspirin and ask when to stop before your surgery.   __X__ One Week prior to surgery- Stop Anti-inflammatories such as Ibuprofen, Aleve, Advil, Motrin, meloxicam (MOBIC), diclofenac, etodolac, ketorolac, Toradol, Daypro, piroxicam, Goody's or BC powders. OK TO USE TYLENOL IF NEEDED   __X__ Stop supplements until after surgery.    ____ Bring C-Pap to the hospital.    If you have any questions regarding your pre-procedure instructions,  Please call Pre-admit Testing at [phone]     Preparing for Surgery with CHLORHEXIDINE GLUCONATE (CHG)  Soap  Chlorhexidine Gluconate (CHG) Soap  o An antiseptic cleaner that kills germs and bonds with the skin to continue killing germs even after washing  o Used for showering the night before surgery and morning of surgery  Before surgery, you can play an important role by reducing the number of germs on your skin.  CHG (Chlorhexidine gluconate) soap is an antiseptic cleanser which kills germs and bonds with the skin to continue killing germs even after washing.  Please do not use if you have an allergy to CHG or antibacterial soaps. If your skin becomes reddened/irritated stop using the CHG.  1. Shower the NIGHT BEFORE SURGERY and the MORNING OF SURGERY with CHG soap.  2. If you choose to wash your hair, wash your hair first as usual with your normal shampoo.  3. After shampooing, rinse your hair and body thoroughly to remove the shampoo.  4. Use CHG as you would any other liquid soap. You can apply CHG directly to the skin and wash gently with a scrungie or a clean washcloth.  5. Apply the CHG soap to your body only from the neck down. Do not use on open wounds or open sores. Avoid contact with your eyes, ears, mouth, and genitals (private parts). Wash face and genitals (private parts) with your normal soap.  6. Wash thoroughly, paying special attention to the area where your surgery will be performed.  7. Thoroughly rinse your body with warm water.  8. Do not shower/wash with your normal soap after using and rinsing off the CHG soap.  9. Pat yourself dry with a clean towel.  10. Wear clean pajamas to bed the night before surgery.  12. Place clean sheets on your bed the night of your first shower and do not sleep with pets.  13. Shower again with the CHG soap on the day of surgery prior to arriving at the hospital.  14. Do not apply any deodorants/lotions/powders.  15. Please wear clean clothes to the hospital.

## 2022-03-16 MED ORDER — CEFAZOLIN IN SODIUM CHLORIDE 2-0.9 GM/100ML-% IV SOLN
2.0000 g | Freq: Once | INTRAVENOUS | Status: DC
  Filled 2022-03-16: qty 100

## 2022-03-16 MED ORDER — CEFAZOLIN SODIUM-DEXTROSE 2-4 GM/100ML-% IV SOLN
2.0000 g | INTRAVENOUS | Status: AC
  Administered 2022-03-17: 2 g via INTRAVENOUS

## 2022-03-16 MED ORDER — LACTATED RINGERS IV SOLN: INTRAVENOUS | Status: DC

## 2022-03-16 MED ORDER — CHLORHEXIDINE GLUCONATE 0.12 % MT SOLN: 15.0000 mL | Freq: Once | OROMUCOSAL | Status: AC

## 2022-03-16 MED ORDER — ORAL CARE MOUTH RINSE: 15.0000 mL | Freq: Once | OROMUCOSAL | Status: AC

## 2022-03-17 ENCOUNTER — Other Ambulatory Visit: Payer: Self-pay

## 2022-03-17 ENCOUNTER — Encounter
Admission: RE | Disposition: A | Discharge status: Home / Self Care | Payer: Self-pay | Source: Ambulatory Visit | Attending: Neurosurgery

## 2022-03-17 ENCOUNTER — Ambulatory Visit: Payer: 59

## 2022-03-17 ENCOUNTER — Telehealth: Payer: Self-pay

## 2022-03-17 ENCOUNTER — Ambulatory Visit
Admission: RE | Admit: 2022-03-17 | Discharge: 2022-03-17 | Disposition: A | Discharge status: Home / Self Care | Payer: 59 | Source: Ambulatory Visit | Attending: Neurosurgery | Admitting: Neurosurgery

## 2022-03-17 ENCOUNTER — Ambulatory Visit: Payer: 59 | Admitting: Anesthesiology

## 2022-03-17 ENCOUNTER — Encounter: Payer: Self-pay | Admitting: Neurosurgery

## 2022-03-17 ENCOUNTER — Ambulatory Visit: Payer: 59 | Admitting: Urgent Care

## 2022-03-17 DIAGNOSIS — M5412 Radiculopathy, cervical region: Secondary | ICD-10-CM

## 2022-03-17 DIAGNOSIS — M50123 Cervical disc disorder at C6-C7 level with radiculopathy: Secondary | ICD-10-CM | POA: Insufficient documentation

## 2022-03-17 DIAGNOSIS — Z01818 Encounter for other preprocedural examination: Secondary | ICD-10-CM

## 2022-03-17 HISTORY — PX: CERVICAL DISC ARTHROPLASTY: SHX587

## 2022-03-17 LAB — ABO/RH: ABO/RH(D): O POS

## 2022-03-17 SURGERY — CERVICAL ANTERIOR DISC ARTHROPLASTY
Anesthesia: General | Site: Neck

## 2022-03-17 MED ORDER — SURGIFLO WITH THROMBIN (HEMOSTATIC MATRIX KIT) OPTIME
TOPICAL | Status: DC | PRN
  Administered 2022-03-17: 1 via TOPICAL

## 2022-03-17 MED ORDER — FENTANYL CITRATE (PF) 100 MCG/2ML IJ SOLN: 25.0000 ug | INTRAMUSCULAR | Status: DC | PRN

## 2022-03-17 MED ORDER — PROPOFOL 10 MG/ML IV BOLUS
INTRAVENOUS | Status: DC | PRN
  Administered 2022-03-17: 130 mg via INTRAVENOUS
  Administered 2022-03-17: 30 mg via INTRAVENOUS

## 2022-03-17 MED ORDER — METHOCARBAMOL 1000 MG/10ML IJ SOLN
500.0000 mg | Freq: Once | INTRAMUSCULAR | Status: AC
  Administered 2022-03-17: 500 mg via INTRAVENOUS

## 2022-03-17 MED ORDER — LIDOCAINE HCL (PF) 2 % IJ SOLN
INTRAMUSCULAR | Status: AC
  Filled 2022-03-17: qty 5

## 2022-03-17 MED ORDER — SUCCINYLCHOLINE CHLORIDE 200 MG/10ML IV SOSY
PREFILLED_SYRINGE | INTRAVENOUS | Status: DC | PRN
  Administered 2022-03-17: 100 mg via INTRAVENOUS

## 2022-03-17 MED ORDER — DEXAMETHASONE SODIUM PHOSPHATE 10 MG/ML IJ SOLN
INTRAMUSCULAR | Status: AC
  Filled 2022-03-17: qty 1

## 2022-03-17 MED ORDER — LIDOCAINE HCL (CARDIAC) PF 100 MG/5ML IV SOSY
PREFILLED_SYRINGE | INTRAVENOUS | Status: DC | PRN
  Administered 2022-03-17: 80 mg via INTRAVENOUS

## 2022-03-17 MED ORDER — CYCLOBENZAPRINE HCL 10 MG PO TABS: 10.0000 mg | ORAL_TABLET | Freq: Three times a day (TID) | ORAL | 0 refills | Status: AC | PRN

## 2022-03-17 MED ORDER — OXYCODONE HCL 5 MG PO TABS
ORAL_TABLET | ORAL | Status: AC
  Filled 2022-03-17: qty 1

## 2022-03-17 MED ORDER — ONDANSETRON HCL 4 MG/2ML IJ SOLN
INTRAMUSCULAR | Status: DC | PRN
  Administered 2022-03-17: 4 mg via INTRAVENOUS

## 2022-03-17 MED ORDER — ACETAMINOPHEN 10 MG/ML IV SOLN
INTRAVENOUS | Status: AC
  Filled 2022-03-17: qty 100

## 2022-03-17 MED ORDER — MIDAZOLAM HCL 2 MG/2ML IJ SOLN
INTRAMUSCULAR | Status: DC | PRN
  Administered 2022-03-17: 2 mg via INTRAVENOUS

## 2022-03-17 MED ORDER — DEXAMETHASONE SODIUM PHOSPHATE 10 MG/ML IJ SOLN
INTRAMUSCULAR | Status: DC | PRN
  Administered 2022-03-17: 10 mg via INTRAVENOUS

## 2022-03-17 MED ORDER — CEFAZOLIN SODIUM-DEXTROSE 2-4 GM/100ML-% IV SOLN
INTRAVENOUS | Status: AC
  Filled 2022-03-17: qty 100

## 2022-03-17 MED ORDER — OXYCODONE HCL 5 MG PO TABS: 5.0000 mg | ORAL_TABLET | ORAL | 0 refills | Status: DC | PRN

## 2022-03-17 MED ORDER — ONDANSETRON HCL 4 MG/2ML IJ SOLN
INTRAMUSCULAR | Status: AC
  Filled 2022-03-17: qty 2

## 2022-03-17 MED ORDER — PROPOFOL 1000 MG/100ML IV EMUL
INTRAVENOUS | Status: AC
  Filled 2022-03-17: qty 100

## 2022-03-17 MED ORDER — SEVOFLURANE IN SOLN
RESPIRATORY_TRACT | Status: AC
  Filled 2022-03-17: qty 250

## 2022-03-17 MED ORDER — FENTANYL CITRATE (PF) 250 MCG/5ML IJ SOLN
INTRAMUSCULAR | Status: AC
  Filled 2022-03-17: qty 5

## 2022-03-17 MED ORDER — OXYCODONE HCL 5 MG PO TABS
5.0000 mg | ORAL_TABLET | ORAL | Status: DC | PRN
  Administered 2022-03-17: 5 mg via ORAL

## 2022-03-17 MED ORDER — OXYCODONE HCL 5 MG PO TABS: 5.0000 mg | ORAL_TABLET | ORAL | 0 refills | Status: AC | PRN

## 2022-03-17 MED ORDER — ONDANSETRON HCL 4 MG/2ML IJ SOLN: 4.0000 mg | Freq: Once | INTRAMUSCULAR | Status: DC | PRN

## 2022-03-17 MED ORDER — FENTANYL CITRATE (PF) 100 MCG/2ML IJ SOLN
INTRAMUSCULAR | Status: DC | PRN
  Administered 2022-03-17 (×3): 50 ug via INTRAVENOUS

## 2022-03-17 MED ORDER — PHENYLEPHRINE HCL (PRESSORS) 10 MG/ML IV SOLN
INTRAVENOUS | Status: DC | PRN
  Administered 2022-03-17: 80 ug via INTRAVENOUS
  Administered 2022-03-17: 160 ug via INTRAVENOUS
  Administered 2022-03-17 (×4): 80 ug via INTRAVENOUS

## 2022-03-17 MED ORDER — ACETAMINOPHEN 10 MG/ML IV SOLN
INTRAVENOUS | Status: DC | PRN
  Administered 2022-03-17: 1000 mg via INTRAVENOUS

## 2022-03-17 MED ORDER — PROPOFOL 500 MG/50ML IV EMUL
INTRAVENOUS | Status: DC | PRN
  Administered 2022-03-17: 100 ug/kg/min via INTRAVENOUS

## 2022-03-17 MED ORDER — CHLORHEXIDINE GLUCONATE 0.12 % MT SOLN
OROMUCOSAL | Status: AC
  Administered 2022-03-17: 15 mL via OROMUCOSAL
  Filled 2022-03-17: qty 15

## 2022-03-17 MED ORDER — SENNA 8.6 MG PO TABS: 1.0000 | ORAL_TABLET | Freq: Every day | ORAL | 0 refills | Status: DC | PRN

## 2022-03-17 MED ORDER — 0.9 % SODIUM CHLORIDE (POUR BTL) OPTIME
TOPICAL | Status: DC | PRN
  Administered 2022-03-17: 1000 mL

## 2022-03-17 MED ORDER — METHOCARBAMOL 1000 MG/10ML IJ SOLN
500.0000 mg | Freq: Once | INTRAMUSCULAR | Status: DC
  Filled 2022-03-17: qty 5

## 2022-03-17 MED ORDER — MIDAZOLAM HCL 2 MG/2ML IJ SOLN
INTRAMUSCULAR | Status: AC
  Filled 2022-03-17: qty 2

## 2022-03-17 MED ORDER — SUCCINYLCHOLINE CHLORIDE 200 MG/10ML IV SOSY
PREFILLED_SYRINGE | INTRAVENOUS | Status: AC
  Filled 2022-03-17: qty 10

## 2022-03-17 MED ORDER — BUPIVACAINE-EPINEPHRINE (PF) 0.5% -1:200000 IJ SOLN
INTRAMUSCULAR | Status: DC | PRN
  Administered 2022-03-17: 5 mL

## 2022-03-17 MED ORDER — BUPIVACAINE-EPINEPHRINE (PF) 0.5% -1:200000 IJ SOLN
INTRAMUSCULAR | Status: AC
  Filled 2022-03-17: qty 30

## 2022-03-17 SURGICAL SUPPLY — 43 items
ADH SKN CLS APL DERMABOND .7 (GAUZE/BANDAGES/DRESSINGS) ×1
AGENT HMST KT MTR STRL THRMB (HEMOSTASIS) ×1
BUR NEURO DRILL SOFT 3.0X3.8M (BURR) ×1 IMPLANT
DERMABOND ADVANCED .7 DNX12 (GAUZE/BANDAGES/DRESSINGS) ×1 IMPLANT
DISC MOBI-C CERVICAL 13X17 H6 (Miscellaneous) IMPLANT
DRAPE C ARM PK CFD 31 SPINE (DRAPES) ×1 IMPLANT
DRAPE LAPAROTOMY 77X122 PED (DRAPES) ×1 IMPLANT
DRAPE MICROSCOPE SPINE 48X150 (DRAPES) ×1 IMPLANT
DRAPE SURG 17X11 SM STRL (DRAPES) ×1 IMPLANT
ELECT CAUTERY BLADE TIP 2.5 (TIP) ×1
ELECT REM PT RETURN 9FT ADLT (ELECTROSURGICAL) ×1
ELECTRODE CAUTERY BLDE TIP 2.5 (TIP) ×1 IMPLANT
ELECTRODE REM PT RTRN 9FT ADLT (ELECTROSURGICAL) ×1 IMPLANT
GLOVE BIOGEL PI IND STRL 6.5 (GLOVE) ×1 IMPLANT
GLOVE BIOGEL PI IND STRL 8.5 (GLOVE) ×1 IMPLANT
GLOVE SURG SYN 6.5 ES PF (GLOVE) ×1 IMPLANT
GLOVE SURG SYN 6.5 PF PI (GLOVE) ×1 IMPLANT
GLOVE SURG SYN 8.5  E (GLOVE) ×3
GLOVE SURG SYN 8.5 E (GLOVE) ×3 IMPLANT
GLOVE SURG SYN 8.5 PF PI (GLOVE) ×3 IMPLANT
GOWN SRG LRG LVL 4 IMPRV REINF (GOWNS) ×1 IMPLANT
GOWN SRG XL LVL 3 NONREINFORCE (GOWNS) ×1 IMPLANT
GOWN STRL NON-REIN TWL XL LVL3 (GOWNS) ×1
GOWN STRL REIN LRG LVL4 (GOWNS) ×1
KIT TURNOVER KIT A (KITS) ×1 IMPLANT
MANIFOLD NEPTUNE II (INSTRUMENTS) ×1 IMPLANT
MARKER SKIN DUAL TIP RULER LAB (MISCELLANEOUS) ×2 IMPLANT
NS IRRIG 1000ML POUR BTL (IV SOLUTION) ×1 IMPLANT
PACK LAMINECTOMY NEURO (CUSTOM PROCEDURE TRAY) ×1 IMPLANT
PAD ARMBOARD 7.5X6 YLW CONV (MISCELLANEOUS) ×1 IMPLANT
PIN CASPAR SPINAL 12MM (PIN) IMPLANT
SOLUTION IRRIG SURGIPHOR (IV SOLUTION) ×1 IMPLANT
SPONGE KITTNER 5P (MISCELLANEOUS) ×1 IMPLANT
STAPLER SKIN PROX 35W (STAPLE) IMPLANT
SURGIFLO W/THROMBIN 8M KIT (HEMOSTASIS) ×1 IMPLANT
SUT DVC VLOC 3-0 CL 6 P-12 (SUTURE) IMPLANT
SUT VIC AB 3-0 SH 8-18 (SUTURE) ×1 IMPLANT
SYR 30ML LL (SYRINGE) ×1 IMPLANT
TAPE CLOTH 3X10 WHT NS LF (GAUZE/BANDAGES/DRESSINGS) ×1 IMPLANT
TOWEL OR 17X26 4PK STRL BLUE (TOWEL DISPOSABLE) ×3 IMPLANT
TRAP FLUID SMOKE EVACUATOR (MISCELLANEOUS) ×1 IMPLANT
TRAY FOLEY MTR SLVR 16FR STAT (SET/KITS/TRAYS/PACK) IMPLANT
TUBING CONNECTING 10 (TUBING) ×1 IMPLANT

## 2022-03-17 NOTE — Anesthesia Procedure Notes (Signed)
Procedure Name: Intubation Date/Time: 03/17/2022 7:31 AM  Performed by: Omer Jack, CRNAPre-anesthesia Checklist: Patient identified, Patient being monitored, Timeout performed, Emergency Drugs available and Suction available Patient Re-evaluated:Patient Re-evaluated prior to induction Oxygen Delivery Method: Circle system utilized Preoxygenation: Pre-oxygenation with 100% oxygen Induction Type: IV induction Ventilation: Mask ventilation without difficulty Laryngoscope Size: 3 and McGraph Grade View: Grade I Tube type: Oral Tube size: 6.5 mm Number of attempts: 1 Airway Equipment and Method: Stylet Placement Confirmation: ETT inserted through vocal cords under direct vision, positive ETCO2 and breath sounds checked- equal and bilateral Secured at: 20 cm Tube secured with: Tape Dental Injury: Teeth and Oropharynx as per pre-operative assessment

## 2022-03-17 NOTE — Transfer of Care (Signed)
Immediate Anesthesia Transfer of Care Note  Patient: Elizabeth Rivers  Procedure(s) Performed: C6-7 ARTHROPLASTY (Neck)  Patient Location: PACU  Anesthesia Type:General  Level of Consciousness: drowsy and patient cooperative  Airway & Oxygen Therapy: Patient Spontanous Breathing and Patient connected to face mask oxygen  Post-op Assessment: Report given to RN and Post -op Vital signs reviewed and stable  Post vital signs: Reviewed and stable  Last Vitals:  Vitals Value Taken Time  BP 117/66 03/17/22 0925  Temp 36.8 C 03/17/22 0925  Pulse 82 03/17/22 0927  Resp 20 03/17/22 0927  SpO2 93 % 03/17/22 0927  Vitals shown include unvalidated device data.  Last Pain:  Vitals:   03/17/22 0628  TempSrc: Oral  PainSc: 0-No pain         Complications: No notable events documented.

## 2022-03-17 NOTE — Discharge Instructions (Addendum)
Your surgeon has performed an operation on your cervical spine (neck) to relieve pressure on the spinal cord and/or nerves. This involved making an incision in the front of your neck and removing one or more of the discs that support your spine. Next, a small piece of bone, a titanium plate, and screws were used to fuse two or more of the vertebrae (bones) together.  The following are instructions to help in your recovery once you have been discharged from the hospital. Even if you feel well, it is important that you follow these activity guidelines. If you do not let your neck heal properly from the surgery, you can increase the chance of return of your symptoms and other complications.  Activity    No bending, lifting, or twisting ("BLT"). Avoid lifting objects heavier than 10 pounds (gallon milk jug).  Where possible, avoid household activities that involve lifting, bending, reaching, pushing, or pulling such as laundry, vacuuming, grocery shopping, and childcare. Try to arrange for help from friends and family for these activities while your back heals.  Increase physical activity slowly as tolerated.  Taking short walks is encouraged, but avoid strenuous exercise. Do not jog, run, bicycle, lift weights, or participate in any other exercises unless specifically allowed by your doctor.  Talk to your doctor before resuming sexual activity.  You should not drive until cleared by your doctor.  Until released by your doctor, you should not return to work or school.  You should rest at home and let your body heal.   You may shower three days after your surgery.  After showering, lightly dab your incision dry. Do not take a tub bath or go swimming until approved by your doctor at your follow-up appointment.   If you smoke, we strongly recommend that you quit.  Smoking has been proven to interfere with normal bone healing and will dramatically reduce the success rate of your surgery. Please contact  [facility] (800-QUIT-NOW) and use the resources at www.[facility].com for assistance in stopping smoking.  Surgical Incision   Keep your incision area clean and dry.  Your incision was closed with Dermabond glue. Theglue should begin to peel away within about a week.  Diet           You may return to your usual diet. However, you may experience discomfort when swallowing in the first month after your surgery. This is normal. You may find that softer foods are more comfortable for you to swallow. Be sure to stay hydrated.  When to Contact us  You may experience pain in your neck and/or pain between your shoulder blades. This is normal and should improve in the next few weeks with the help of pain medication, muscle relaxers, and rest. Some patients report that a warm compress on the back of the neck or between the shoulder blades helps.  However, should you experience any of the following, contact us immediately: New numbness or weakness Pain that is progressively getting worse, and is not relieved by your pain medication, muscle relaxers, rest, and warm compresses Bleeding, redness, swelling, pain, or drainage from surgical incision Chills or flu-like symptoms Fever greater than 101.0 F (38.3 C) Inability to eat, drink fluids, or take medications Problems with bowel or bladder functions Difficulty breathing or shortness of breath Warmth, tenderness, or swelling in your calf Contact Information During office hours (Monday-Friday 9 am to 5 pm), please call your physician at [phone] and ask for Sharlot Gowda After hours and weekends, please call  [phone] and speak with the neurosurgeon on call For a life-threatening emergency, call 911  AMBULATORY SURGERY  DISCHARGE INSTRUCTIONS   The drugs that you were given will stay in your system until tomorrow so for the next 24 hours you should not:  Drive an automobile Make any legal decisions Drink any alcoholic beverage   You  may resume regular meals tomorrow.  Today it is better to start with liquids and gradually work up to solid foods.  You may eat anything you prefer, but it is better to start with liquids, then soup and crackers, and gradually work up to solid foods.   Please notify your doctor immediately if you have any unusual bleeding, trouble breathing, redness and pain at the surgery site, drainage, fever, or pain not relieved by medication.    Additional Instructions: Please contact your physician with any problems or Same Day Surgery at [phone], Monday through Friday 6 am to 4 pm, or [place] at [facility] number at [phone].

## 2022-03-17 NOTE — Op Note (Signed)
Indications: Elizabeth Rivers presented with M54.12 cervical radiculopathy . She failed conservative management prompting surgical intervention.  Findings: disc herniation C6-7  Preoperative Diagnosis: M54.12 cervical radiculopathy  Postoperative Diagnosis: same   EBL: 25 ml IVF: see AR ml Drains: none Disposition: Extubated and Stable to PACU Complications: none  No foley catheter was placed.   Preoperative Note:   Risks of surgery discussed include: infection, bleeding, stroke, coma, death, paralysis, CSF leak, nerve/spinal cord injury, numbness, tingling, weakness, complex regional pain syndrome, recurrent stenosis and/or disc herniation, vascular injury, development of instability, neck/back pain, need for further surgery, persistent symptoms, development of deformity, and the risks of anesthesia. The patient understood these risks and agreed to proceed.  Operative Note:  Procedure:  1) Cervical Disc Arthroplasty at C6/7 using a LDR Mobi-C device   Procedure: After obtaining informed consent, the patient taken to the operating room, placed in supine position, general anesthesia induced.  The patient had a small shoulder roll placed behind the neck.  The patient received preop antibiotics and IV Decadron.  The patient had a neck incision outlined, was prepped and draped in usual sterile fashion. The incision was injected with local anesthetic.   An incision was opened, dissection taken down medial to the carotid artery and jugular vein, lateral to the trachea and esophagus.  The prevertebral fascia identified and a localizing x-ray demonstrated the correct level.  The longus colli were dissected laterally, and self-retaining retractors placed to open the operative field. The microscope was then brought into the field.  With this complete, distractor pins were placed in the vertebral bodies of C6 and C7. The distractor was placed, and the anulus at C6/7 was opened using a bovie.  Curettes  and pituitary rongeurs used to remove the majority of disk, then the drill was used to remove the posterior osteophyte and begin the foraminotomies. The nerve hook was used to elevate the posterior longitudinal ligament, which was then removed with Kerrison rongeurs. The microblunt nerve hook could be passed out the foramen bilaterally.   Meticulous hemostasis was obtained.  A trial spacer was used to size the disc space. Using flouroscopic guidance, a 17 mm width x 13 mm depth x 6 mm height Mobi-C was then inserted in the prepared disc space.  The caspar distractor was removed, and bone wax used for hemostasis. Final AP and lateral radiographs were taken.   With the disc arthroplasty in good position, the wound was irrigated copiously and meticulous hemostasis obtained.  Wound was closed in 2 layers using interrupted inverted 3-0 Vicryl sutures.  The wound was dressed with dermabond, the head of bed at 30 degrees, taken to recovery room in stable condition.  No new postop neurological deficits were identified.  Sponge and pattie counts were correct at the end of the procedure.     I performed the entire procedure with the assistance of Elizabeth Charity PA as an Designer, television/film set. An assistant was required for this procedure due to the complexity.  The assistant provided assistance in tissue manipulation and suction, and was required for the successful and safe performance of the procedure. I performed the critical portions of the procedure.   Venetia Night MD

## 2022-03-17 NOTE — Interval H&P Note (Signed)
History and Physical Interval Note:  03/17/2022 7:07 AM  Elizabeth Rivers  has presented today for surgery, with the diagnosis of M54.12 cervical radiculopathy.  The various methods of treatment have been discussed with the patient and family. After consideration of risks, benefits and other options for treatment, the patient has consented to  Procedure(s): C6-7 ARTHROPLASTY (N/A) as a surgical intervention.  The patient's history has been reviewed, patient examined, no change in status, stable for surgery.  I have reviewed the patient's chart and labs.  Questions were answered to the patient's satisfaction.    Heart sounds normal no MRG. Chest Clear to Auscultation Bilaterally.   [name]

## 2022-03-17 NOTE — Telephone Encounter (Signed)
I called [facility] in [place] and they confirmed they have it in stock.

## 2022-03-17 NOTE — Telephone Encounter (Signed)
Notified [facility] to cancel ox[facility]codone rx

## 2022-03-17 NOTE — Anesthesia Preprocedure Evaluation (Signed)
Anesthesia Evaluation  Patient identified by MRN, date of birth, ID band Patient awake    Reviewed: Allergy & Precautions, H&P , NPO status , Patient's Chart, lab work & pertinent test results, reviewed documented beta blocker date and time   Airway Mallampati: II  TM Distance: >3 FB Neck ROM: full    Dental  (+) Teeth Intact   Pulmonary neg pulmonary ROS   Pulmonary exam normal        Cardiovascular Exercise Tolerance: Good negative cardio ROS Normal cardiovascular exam Rhythm:regular Rate:Normal     Neuro/Psych negative neurological ROS  negative psych ROS   GI/Hepatic Neg liver ROS,GERD  Medicated,,  Endo/Other  negative endocrine ROS    Renal/GU negative Renal ROS  negative genitourinary   Musculoskeletal   Abdominal   Peds  Hematology negative hematology ROS (+)   Anesthesia Other Findings Past Medical History: No date: GERD (gastroesophageal reflux disease) No date: Pre-diabetes Past Surgical History: No date: BREAST SURGERY; Left     Comment:  extra nipple removed below left breast in teens per pt No date: LASIK BMI    Body Mass Index: 41.81 kg/m     Reproductive/Obstetrics negative OB ROS                             Anesthesia Physical Anesthesia Plan  ASA: 2  Anesthesia Plan: General ETT   Post-op Pain Management:    Induction:   PONV Risk Score and Plan: 4 or greater  Airway Management Planned:   Additional Equipment:   Intra-op Plan:   Post-operative Plan:   Informed Consent: I have reviewed the patients History and Physical, chart, labs and discussed the procedure including the risks, benefits and alternatives for the proposed anesthesia with the patient or authorized representative who has indicated his/her understanding and acceptance.     Dental Advisory Given  Plan Discussed with: CRNA  Anesthesia Plan Comments:        Anesthesia Quick  Evaluation

## 2022-03-17 NOTE — Telephone Encounter (Signed)
-----   Message from Rockey Situ sent at 03/17/2022  3:51 PM EST ----- Contact: [phone] C6-7 arthroplasty on 03/17/22 Mr.Lincoln [facility] [place] is out of ox[facility]codone can you send it to [facility] or [facility].

## 2022-03-17 NOTE — Telephone Encounter (Signed)
PMP reviewed and is appropriate.   As below, [facility]cod[facility] script cancelled at [facility].   Please let patient know that [facility]one [facility] [place].

## 2022-03-17 NOTE — Telephone Encounter (Signed)
Elizabeth Rivers notified of [facility]codone rx [facility] at [facility] and sent to [facility].

## 2022-03-17 NOTE — Progress Notes (Signed)
Up ambulating to bathroom, voiding and tolerating fluids/food. VSS. Voices readiness for discharge to home.

## 2022-03-18 NOTE — Anesthesia Postprocedure Evaluation (Signed)
Anesthesia Post Note  Patient: Elizabeth Rivers  Procedure(s) Performed: C6-7 ARTHROPLASTY (Neck)  Patient location during evaluation: PACU Anesthesia Type: General Level of consciousness: awake and alert Pain management: pain level controlled Vital Signs Assessment: post-procedure vital signs reviewed and stable Respiratory status: spontaneous breathing, nonlabored ventilation, respiratory function stable and patient connected to nasal cannula oxygen Cardiovascular status: blood pressure returned to baseline and stable Postop Assessment: no apparent nausea or vomiting Anesthetic complications: no   No notable events documented.   Last Vitals:  Vitals:   03/17/22 1204 03/17/22 1320  BP: (!) 98/52 116/61  Pulse: 75 80  Resp: 15 17  Temp: 36.4 C   SpO2: 95% 95%    Last Pain:  Vitals:   03/17/22 1320  TempSrc:   PainSc: 5                  Yevette Edwards

## 2022-03-19 ENCOUNTER — Encounter: Payer: Self-pay | Admitting: Neurosurgery

## 2022-03-27 NOTE — Progress Notes (Signed)
   REFERRING PHYSICIAN:  Enid Baas, Md [address] [place],  [place] [address]  DOS: 03/17/22 cervical disc arthroplasty C6-C7  HISTORY OF PRESENT ILLNESS: Elizabeth Rivers is 2 weeks status post cervical disc arthroplasty C6-C7. Given flexeril and oxycodone on discharge from the hospital.   She is not taking flexeril rarely. She is not taking oxycodone. She is taking prn aleve. She has minimal intermittent pain in right shoulder blade. Her preop left arm pain is gone!   PHYSICAL EXAMINATION:  NEUROLOGICAL:  General: In no acute distress.   Awake, alert, oriented to person, place, and time.  Pupils equal round and reactive to light.  Facial tone is symmetric.    Strength: Side Biceps Triceps Deltoid Interossei Grip Wrist Ext. Wrist Flex.  R 5 5 5 5 5 5 5   L 5 5 5 5 5 5 5    Incision c/d/i  Imaging:  Nothing new to review.   Assessment / Plan: CALA KRUCKENBERG is doing well s/p above surgery. Treatment options reviewed with patient and following plan made:   - We discussed activity escalation and I have advised the patient to lift up to 10 pounds until 6 weeks after surgery (until your follow up with Dr. ).   - Reviewed wound care.  - Continue current medications including prn OTC aleve. Take with food.  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  - Follow up as scheduled in 4 weeks and prn.   Advised to contact the office if any questions or concerns arise.   [place] PA-C Dept of Neurosurgery

## 2022-03-28 ENCOUNTER — Ambulatory Visit (INDEPENDENT_AMBULATORY_CARE_PROVIDER_SITE_OTHER): Payer: 59 | Admitting: Orthopedic Surgery

## 2022-03-28 ENCOUNTER — Encounter: Payer: Self-pay | Admitting: Orthopedic Surgery

## 2022-03-28 VITALS — BP 135/74 | HR 90 | Temp 98.0°F

## 2022-03-28 DIAGNOSIS — M5412 Radiculopathy, cervical region: Secondary | ICD-10-CM

## 2022-03-28 DIAGNOSIS — Z09 Encounter for follow-up examination after completed treatment for conditions other than malignant neoplasm: Secondary | ICD-10-CM

## 2022-03-28 DIAGNOSIS — Z9889 Other specified postprocedural states: Secondary | ICD-10-CM

## 2022-04-10 IMAGING — MG DIGITAL DIAGNOSTIC BILAT W/ TOMO W/ CAD
8 series · 8 of 24 positions shown · non-contrast
Comparison: 05/19/2017 and earlier

CLINICAL DATA: 10 centimeter area of thickening in the LEFT axilla
noted on recent physical exam.

EXAM:
DIGITAL DIAGNOSTIC BILATERAL MAMMOGRAM WITH TOMOSYNTHESIS AND CAD;
ULTRASOUND LEFT BREAST LIMITED
TECHNIQUE: Bilateral digital diagnostic mammography and breast tomosynthesis
was performed. The images were evaluated with computer-aided
detection.; Targeted ultrasound examination of the left breast was
performed

[L CC synth-2D]
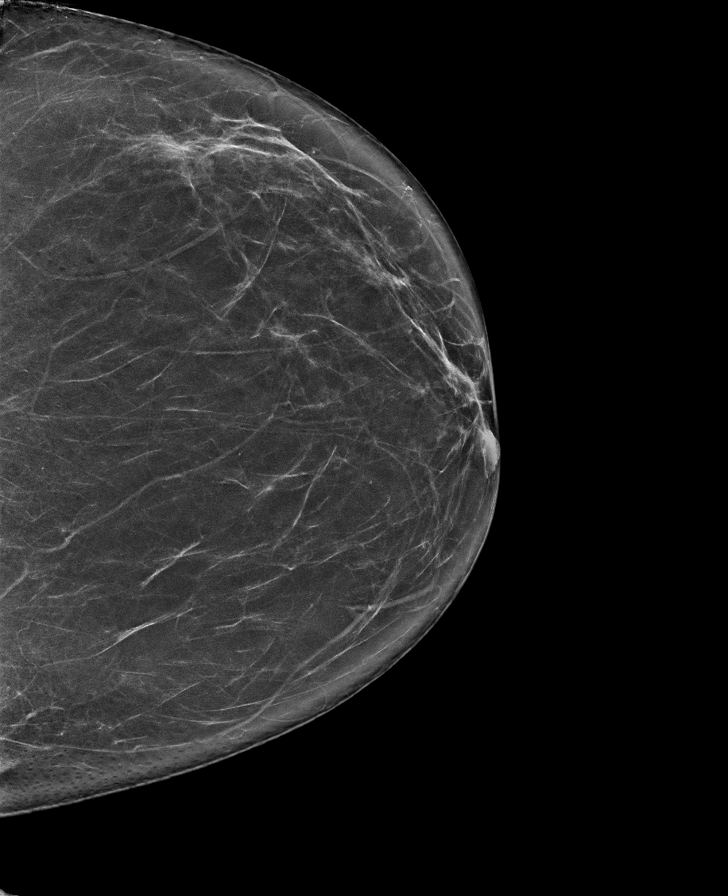

[R CC synth-2D]
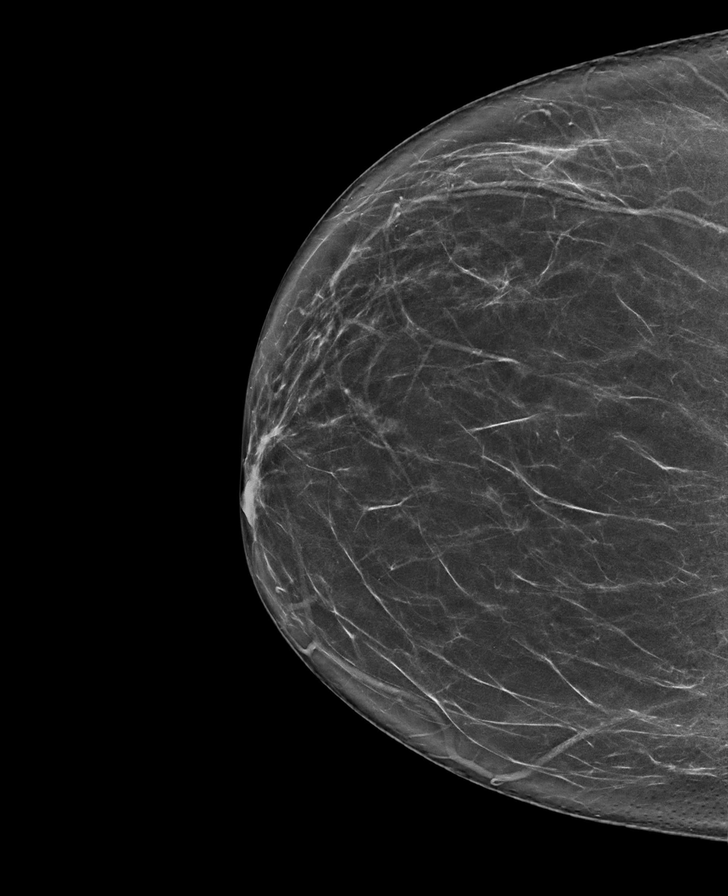

[R MLO synth-2D]
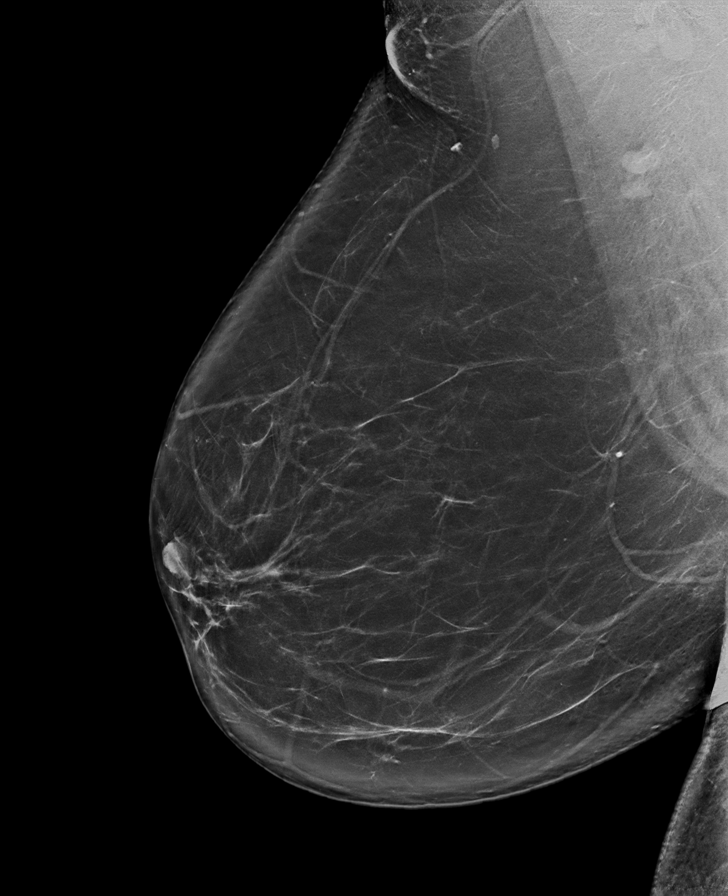

[L MLO synth-2D]
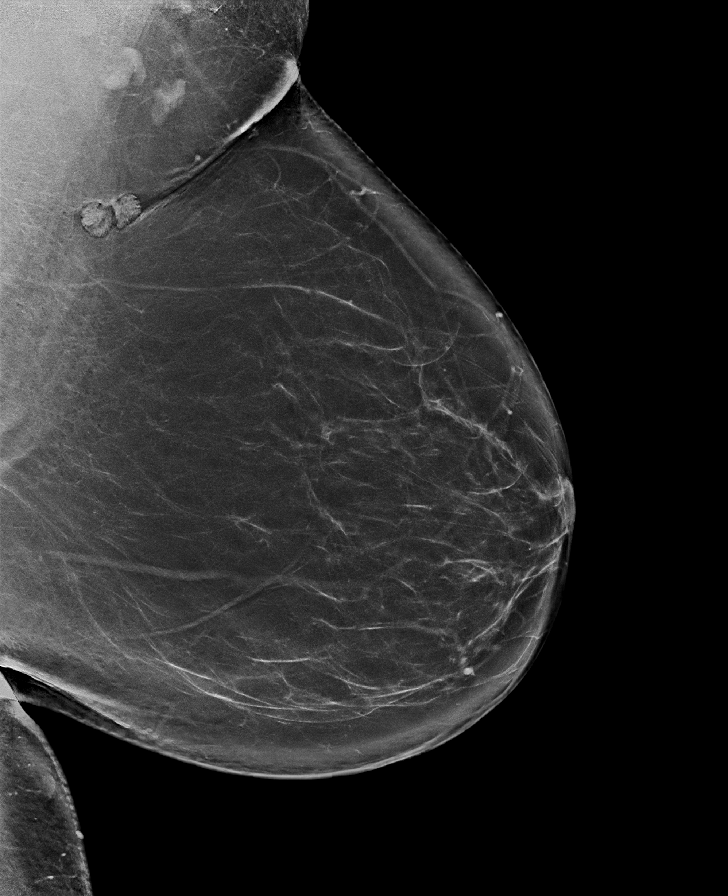

[L MLO tomo · tomo slice 51/101.0]
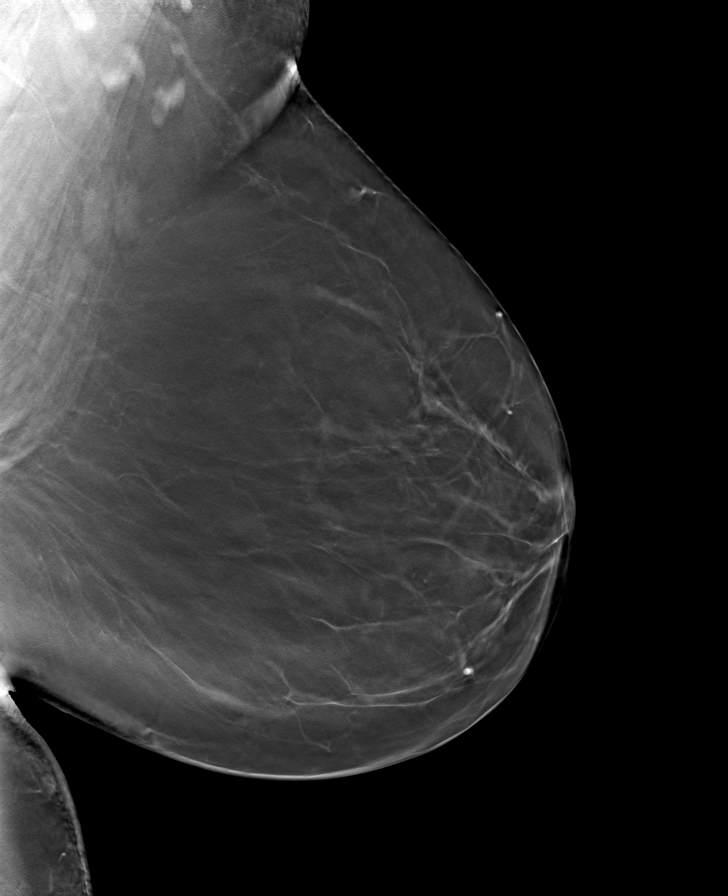

[R MLO tomo · tomo slice 51/100.0]
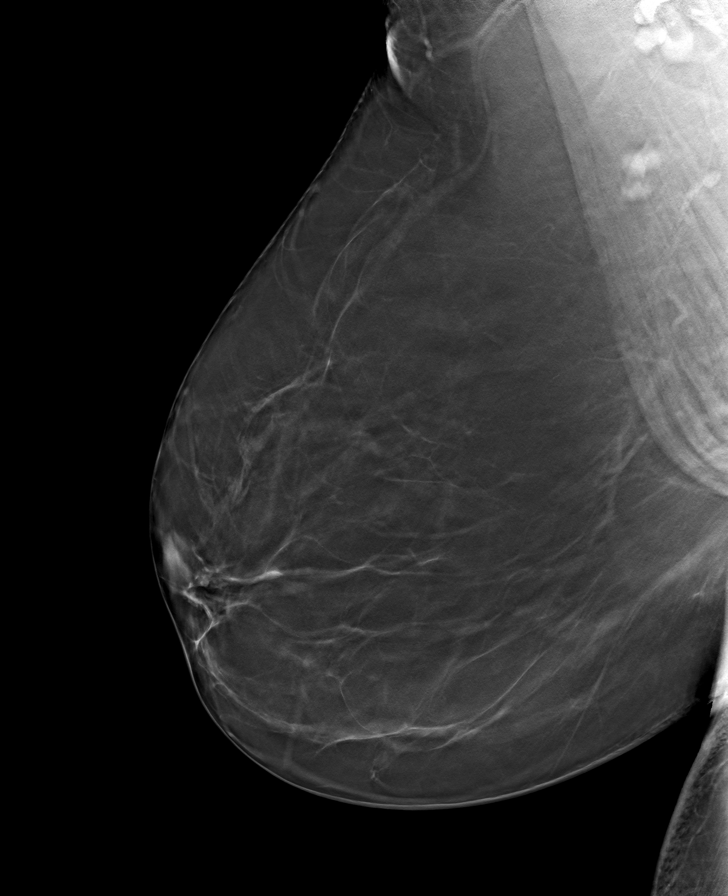

[R CC tomo · tomo slice 40/79.0]
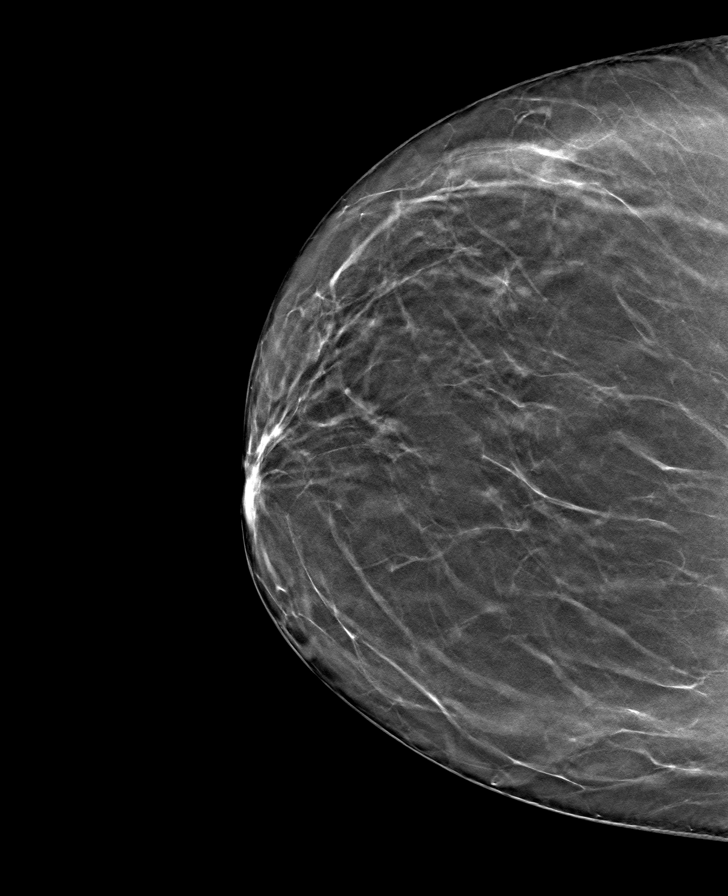

[L CC tomo · tomo slice 41/81.0]
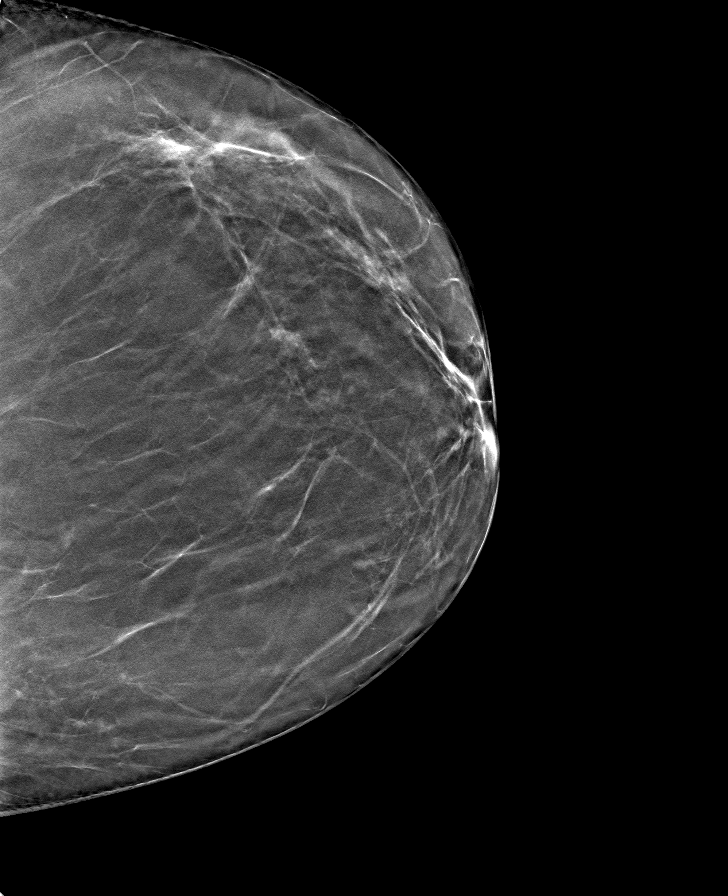

[8 of 24 positions shown; findings below may reference images not displayed]

ACR Breast Density Category b: There are scattered areas of
fibroglandular density.
FINDINGS: No suspicious mass, distortion, or microcalcifications are
identified to suggest presence of malignancy.

On physical exam, I palpate convex soft fullness in the LEFT axilla,
similar to the RIGHT on physical exam. I palpate no discrete mass in
this region.

Targeted ultrasound is performed, showing normal appearing LEFT
axillary contents. No enlarged lymph nodes or other mass.
IMPRESSION: No mammographic or ultrasound evidence for malignancy.

RECOMMENDATION:
Screening mammogram in one year.(Code:IJ-7-LNZ)

I have discussed the findings and recommendations with the patient.
If applicable, a reminder letter will be sent to the patient
regarding the next appointment.

BI-RADS CATEGORY  1: Negative.

## 2022-04-28 ENCOUNTER — Other Ambulatory Visit: Payer: Self-pay

## 2022-04-28 DIAGNOSIS — Z9889 Other specified postprocedural states: Secondary | ICD-10-CM

## 2022-04-29 ENCOUNTER — Ambulatory Visit
Admission: RE | Admit: 2022-04-29 | Discharge: 2022-04-29 | Disposition: A | Discharge status: Home / Self Care | Payer: 59 | Attending: Neurosurgery | Admitting: Neurosurgery

## 2022-04-29 ENCOUNTER — Encounter: Payer: Self-pay | Admitting: Neurosurgery

## 2022-04-29 ENCOUNTER — Ambulatory Visit
Admission: RE | Admit: 2022-04-29 | Discharge: 2022-04-29 | Disposition: A | Discharge status: Home / Self Care | Payer: 59 | Source: Ambulatory Visit | Attending: Neurosurgery | Admitting: Neurosurgery

## 2022-04-29 ENCOUNTER — Ambulatory Visit (INDEPENDENT_AMBULATORY_CARE_PROVIDER_SITE_OTHER): Payer: 59 | Admitting: Neurosurgery

## 2022-04-29 VITALS — BP 126/78 | Temp 98.2°F | Ht 64.0 in | Wt 243.0 lb

## 2022-04-29 DIAGNOSIS — Z9889 Other specified postprocedural states: Secondary | ICD-10-CM | POA: Diagnosis present

## 2022-04-29 DIAGNOSIS — Z09 Encounter for follow-up examination after completed treatment for conditions other than malignant neoplasm: Secondary | ICD-10-CM

## 2022-04-29 DIAGNOSIS — M5412 Radiculopathy, cervical region: Secondary | ICD-10-CM

## 2022-04-29 NOTE — Progress Notes (Signed)
   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 She is doing very well.   PHYSICAL EXAMINATION:  NEUROLOGICAL:  General: In no acute distress.   Awake, alert, oriented to person, place, and time.  Pupils equal round and reactive to light.  Facial tone is symmetric.    Strength: Side Biceps Triceps Deltoid Interossei Grip Wrist Ext. Wrist Flex.  R 5 5 5 5 5 5 5   L 5 5 5 5 5 5 5    Incision c/d/i  Imaging:  No complications noted.  Assessment / Plan: Elizabeth Rivers is doing well s/p above surgery.   She is doing very well.  Her arm and neck pain are completely improved.  We reviewed her activity limitations.  I have released her to return to the office.  I have placed a letter in her chart detailing this.  I will see her back in 4 weeks with x-rays.    Meade Maw MD Dept of Neurosurgery

## 2022-06-09 ENCOUNTER — Other Ambulatory Visit: Payer: Self-pay

## 2022-06-09 DIAGNOSIS — Z9889 Other specified postprocedural states: Secondary | ICD-10-CM

## 2022-06-10 ENCOUNTER — Ambulatory Visit (INDEPENDENT_AMBULATORY_CARE_PROVIDER_SITE_OTHER): Payer: 59 | Admitting: Neurosurgery

## 2022-06-10 ENCOUNTER — Ambulatory Visit
Admission: RE | Admit: 2022-06-10 | Discharge: 2022-06-10 | Disposition: A | Discharge status: Home / Self Care | Payer: 59 | Attending: Neurosurgery | Admitting: Neurosurgery

## 2022-06-10 ENCOUNTER — Encounter: Payer: Self-pay | Admitting: Neurosurgery

## 2022-06-10 ENCOUNTER — Ambulatory Visit
Admission: RE | Admit: 2022-06-10 | Discharge: 2022-06-10 | Disposition: A | Discharge status: Home / Self Care | Payer: 59 | Source: Ambulatory Visit | Attending: Neurosurgery | Admitting: Neurosurgery

## 2022-06-10 VITALS — BP 126/72 | Ht 64.0 in | Wt 243.0 lb

## 2022-06-10 DIAGNOSIS — M5412 Radiculopathy, cervical region: Secondary | ICD-10-CM

## 2022-06-10 DIAGNOSIS — Z9889 Other specified postprocedural states: Secondary | ICD-10-CM | POA: Insufficient documentation

## 2022-06-10 DIAGNOSIS — Z09 Encounter for follow-up examination after completed treatment for conditions other than malignant neoplasm: Secondary | ICD-10-CM

## 2022-06-10 NOTE — Progress Notes (Signed)
   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 She is doing very well.   PHYSICAL EXAMINATION:  NEUROLOGICAL:  General: In no acute distress.   Awake, alert, oriented to person, place, and time.  Pupils equal round and reactive to light.  Facial tone is symmetric.    Strength: Side Biceps Triceps Deltoid Interossei Grip Wrist Ext. Wrist Flex.  R '5 5 5 5 5 5 5  '$ L '5 5 5 5 5 5 5   '$ Incision c/d/i  Imaging:  No complications noted.  Assessment / Plan: Elizabeth Rivers is doing well s/p above surgery.   She is doing very well.  Her arm and neck pain are completely improved.  She is off activity limitations.  I will see her back in 6 months with x-rays.  I am very pleased with her improvements.  Meade Maw MD Dept of Neurosurgery

## 2022-06-26 ENCOUNTER — Other Ambulatory Visit: Payer: Self-pay | Admitting: Internal Medicine

## 2022-06-26 DIAGNOSIS — Z1231 Encounter for screening mammogram for malignant neoplasm of breast: Secondary | ICD-10-CM

## 2022-08-20 ENCOUNTER — Ambulatory Visit
Admission: RE | Admit: 2022-08-20 | Discharge: 2022-08-20 | Disposition: A | Discharge status: Home / Self Care | Payer: 59 | Source: Ambulatory Visit | Attending: Internal Medicine | Admitting: Internal Medicine

## 2022-08-20 DIAGNOSIS — Z1231 Encounter for screening mammogram for malignant neoplasm of breast: Secondary | ICD-10-CM | POA: Insufficient documentation

## 2022-11-21 ENCOUNTER — Ambulatory Visit: Payer: 59

## 2022-11-21 DIAGNOSIS — K2950 Unspecified chronic gastritis without bleeding: Secondary | ICD-10-CM | POA: Diagnosis not present

## 2022-11-21 DIAGNOSIS — D509 Iron deficiency anemia, unspecified: Secondary | ICD-10-CM | POA: Diagnosis present

## 2022-11-21 DIAGNOSIS — K640 First degree hemorrhoids: Secondary | ICD-10-CM | POA: Diagnosis not present
